# Patient Record
Sex: Female | Born: 1975 | Race: White | Hispanic: No | Marital: Married | Smoking: Former smoker
Health system: Southern US, Community
[De-identification: ages and names within clinical notes are randomized; demographics above are authoritative.]

## PROBLEM LIST (undated history)

## (undated) DIAGNOSIS — F4000 Agoraphobia, unspecified: Secondary | ICD-10-CM

## (undated) DIAGNOSIS — F32A Depression, unspecified: Secondary | ICD-10-CM

## (undated) DIAGNOSIS — T7840XA Allergy, unspecified, initial encounter: Secondary | ICD-10-CM

## (undated) HISTORY — DX: Depression, unspecified: F32.A

## (undated) HISTORY — PX: ABDOMINAL HYSTERECTOMY: SHX81

## (undated) HISTORY — DX: Allergy, unspecified, initial encounter: T78.40XA

## (undated) HISTORY — DX: Agoraphobia, unspecified: F40.00

---

## 2001-02-19 ENCOUNTER — Emergency Department (HOSPITAL_COMMUNITY): Admission: EM | Admit: 2001-02-19 | Discharge: 2001-02-19 | Payer: Self-pay | Admitting: *Deleted

## 2001-04-02 ENCOUNTER — Ambulatory Visit (HOSPITAL_COMMUNITY): Admission: RE | Admit: 2001-04-02 | Discharge: 2001-04-02 | Payer: Self-pay | Admitting: *Deleted

## 2001-04-02 ENCOUNTER — Encounter: Payer: Self-pay | Admitting: *Deleted

## 2001-05-12 ENCOUNTER — Ambulatory Visit (HOSPITAL_COMMUNITY): Admission: RE | Admit: 2001-05-12 | Discharge: 2001-05-12 | Payer: Self-pay | Admitting: *Deleted

## 2004-05-11 ENCOUNTER — Emergency Department (HOSPITAL_COMMUNITY): Admission: EM | Admit: 2004-05-11 | Discharge: 2004-05-11 | Payer: Self-pay

## 2005-06-22 ENCOUNTER — Emergency Department (HOSPITAL_COMMUNITY): Admission: EM | Admit: 2005-06-22 | Discharge: 2005-06-22 | Payer: Self-pay | Admitting: Emergency Medicine

## 2005-12-23 ENCOUNTER — Emergency Department (HOSPITAL_COMMUNITY): Admission: EM | Admit: 2005-12-23 | Discharge: 2005-12-23 | Payer: Self-pay | Admitting: Emergency Medicine

## 2006-12-11 IMAGING — CR DG CHEST 2V
2 series · 2 of 2 positions shown · non-contrast
Comparison: none

CLINICAL DATA: Cough and shortness of breath. 
 PA AND LATERAL CHEST - 2 VIEWS:

[view not recorded (1 of 2)]
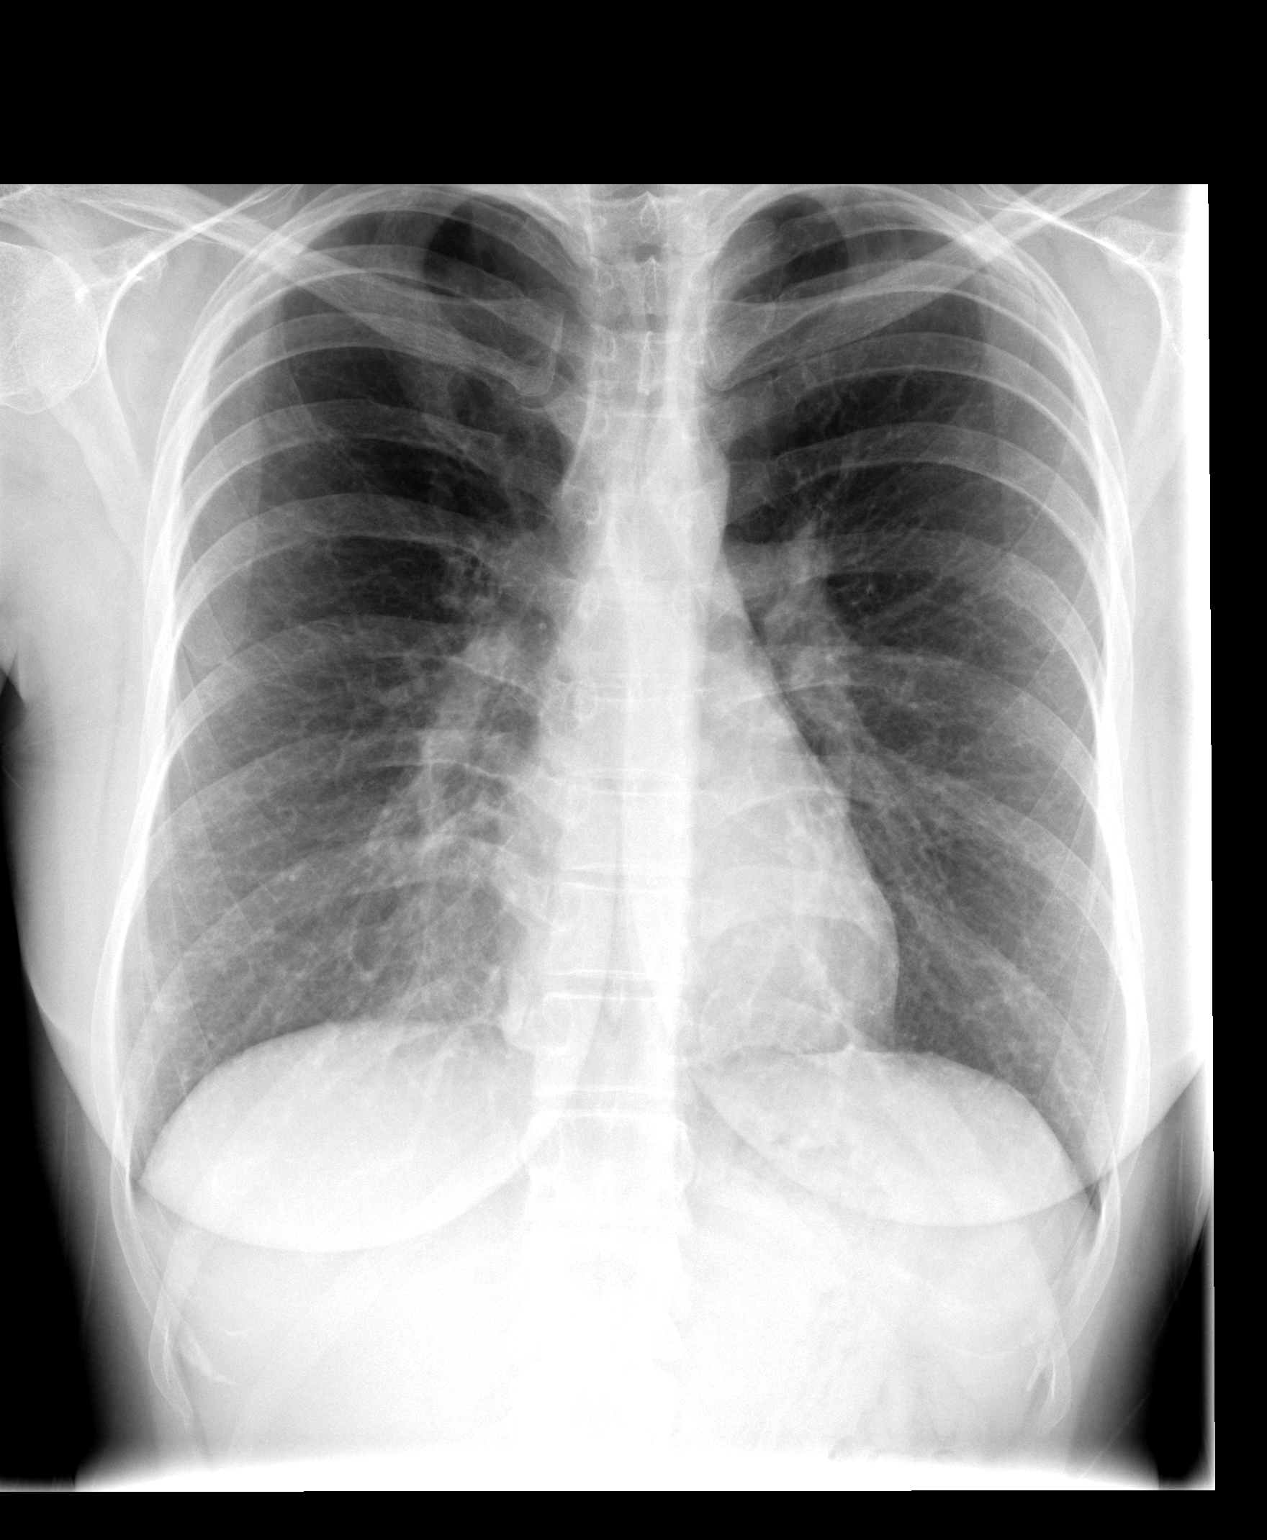

[view not recorded (2 of 2)]
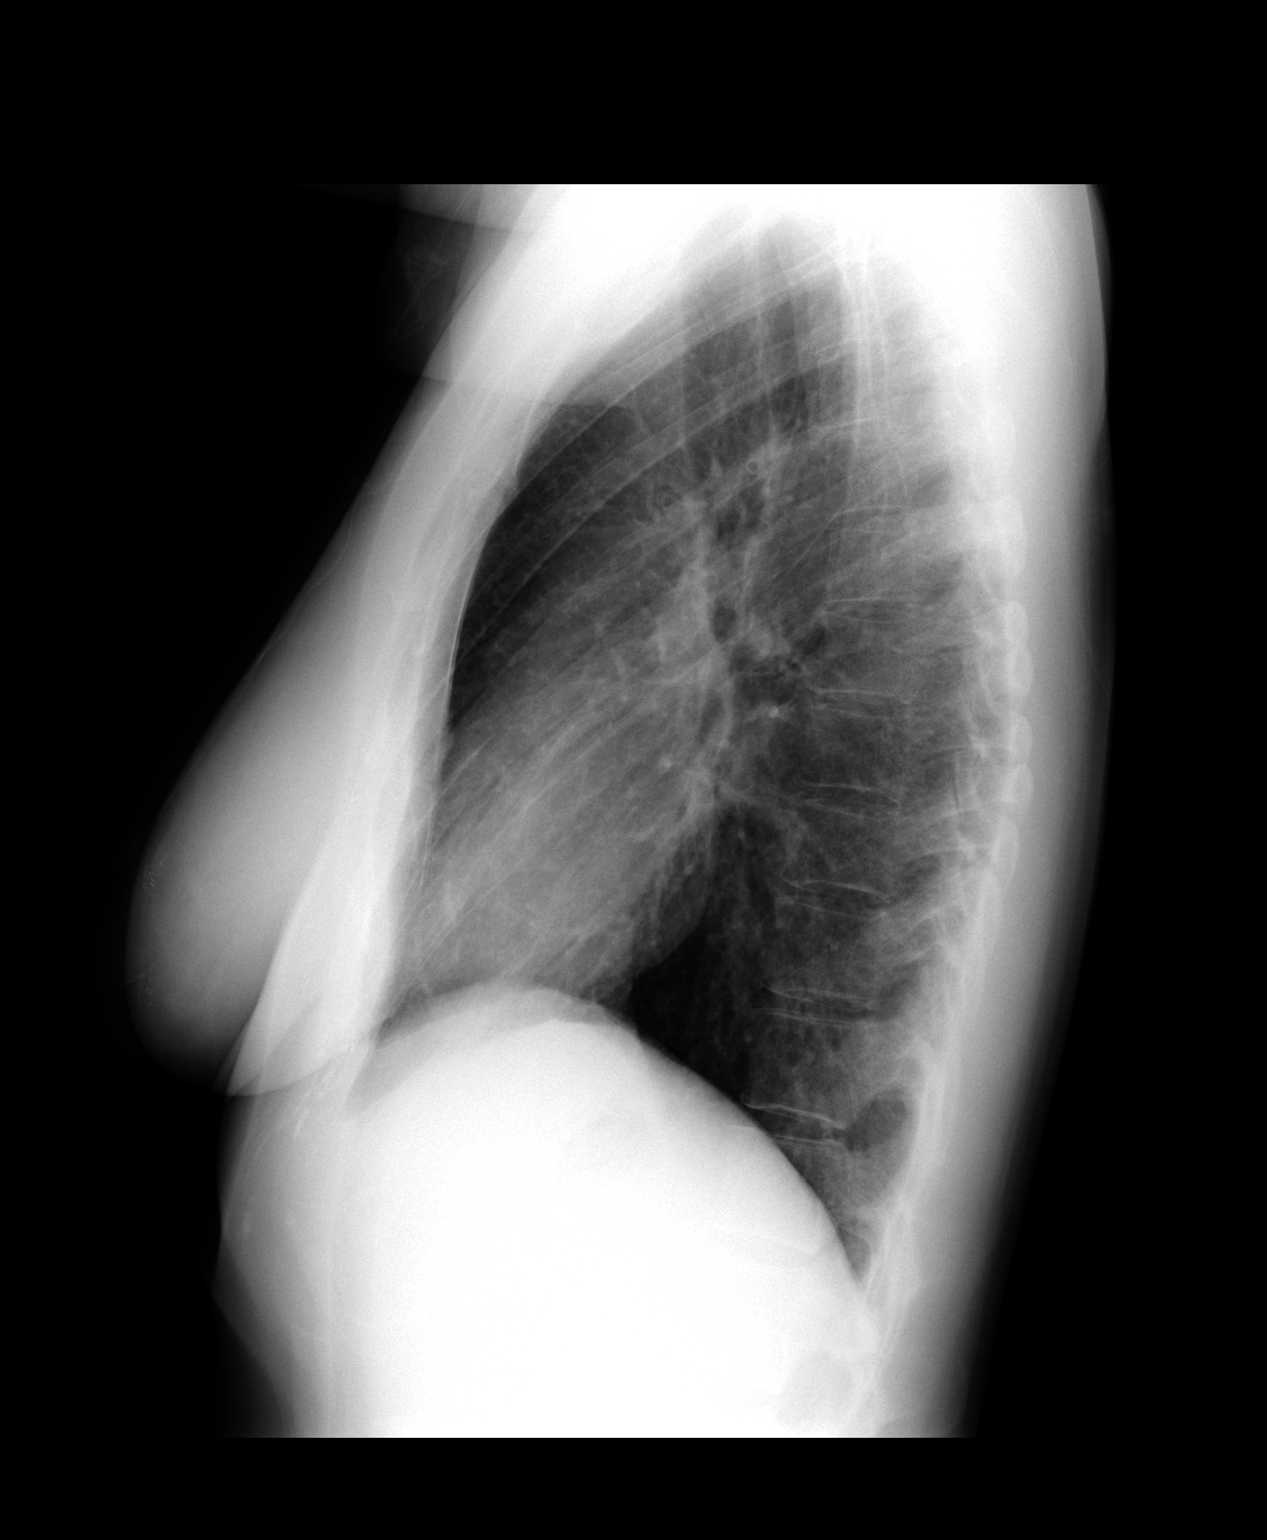

[2 of 2 positions shown; findings below may reference images not displayed]

FINDINGS: The chest appears hyperexpanded but clear.  No pleural effusion.  Heart size normal.  No focal bony abnormality.
IMPRESSION: Hyperexpansion of the lungs with no focal process.

## 2010-10-16 ENCOUNTER — Emergency Department: Payer: Self-pay | Admitting: Emergency Medicine

## 2015-05-11 ENCOUNTER — Other Ambulatory Visit: Payer: Self-pay | Admitting: Family Medicine

## 2015-05-11 DIAGNOSIS — R109 Unspecified abdominal pain: Secondary | ICD-10-CM

## 2018-06-06 ENCOUNTER — Encounter: Payer: Self-pay | Admitting: Emergency Medicine

## 2018-06-06 DIAGNOSIS — F4010 Social phobia, unspecified: Secondary | ICD-10-CM

## 2018-06-06 DIAGNOSIS — F4310 Post-traumatic stress disorder, unspecified: Secondary | ICD-10-CM

## 2018-06-06 DIAGNOSIS — F331 Major depressive disorder, recurrent, moderate: Secondary | ICD-10-CM | POA: Insufficient documentation

## 2018-06-17 ENCOUNTER — Ambulatory Visit: Payer: Self-pay | Admitting: Physician Assistant

## 2018-08-25 ENCOUNTER — Telehealth: Payer: Self-pay | Admitting: Physician Assistant

## 2018-08-25 NOTE — Telephone Encounter (Signed)
Pt. Called and said that the prestiq is giving her bad thoughts and she feels terrible. She wants to stop it and go back on prozac. Please give her a call back. [phone]

## 2018-08-25 NOTE — Telephone Encounter (Signed)
Please advise 

## 2018-08-25 NOTE — Telephone Encounter (Signed)
Can you get chart for me please

## 2018-08-25 NOTE — Telephone Encounter (Signed)
I haven't seen her in 5 months.  When did these sx start?  She needs to make appt for [place] to discuss.  If suicidal, go to [facility].

## 2018-08-25 NOTE — Telephone Encounter (Signed)
done

## 2018-08-26 NOTE — Telephone Encounter (Signed)
Left voicemail making sure she's coming to appt tomorrow with provider, instructed to call back if not able to make it.

## 2018-08-26 NOTE — Telephone Encounter (Signed)
Tried to reach pt but phone says unable to reach and call back later

## 2018-08-27 ENCOUNTER — Ambulatory Visit (INDEPENDENT_AMBULATORY_CARE_PROVIDER_SITE_OTHER): Payer: 59 | Admitting: Physician Assistant

## 2018-08-27 ENCOUNTER — Encounter: Payer: Self-pay | Admitting: Physician Assistant

## 2018-08-27 VITALS — BP 173/90 | HR 88

## 2018-08-27 DIAGNOSIS — F331 Major depressive disorder, recurrent, moderate: Secondary | ICD-10-CM | POA: Diagnosis not present

## 2018-08-27 DIAGNOSIS — R030 Elevated blood-pressure reading, without diagnosis of hypertension: Secondary | ICD-10-CM

## 2018-08-27 DIAGNOSIS — F411 Generalized anxiety disorder: Secondary | ICD-10-CM | POA: Diagnosis not present

## 2018-08-27 MED ORDER — LORAZEPAM 0.5 MG PO TABS: 0.5000 mg | ORAL_TABLET | Freq: Three times a day (TID) | ORAL | 1 refills | Status: DC | PRN

## 2018-08-27 MED ORDER — FLUOXETINE HCL 20 MG PO CAPS: 20.0000 mg | ORAL_CAPSULE | Freq: Every day | ORAL | 1 refills | Status: DC

## 2018-08-27 NOTE — Progress Notes (Signed)
[facility]  Patient ID: Victoria Mendez,  MRN: [id number]  PCP: Patient, No Pcp Per  Date of Evaluation: 08/27/2018 Time spent:25 minutes  Chief Complaint:  Chief Complaint    Medication Problem; Anxiety; Depression      HISTORY/CURRENT STATUS: HPI Not doing well.   She started having brain zaps in Dec.  If she took the Pristiq even a few hours late, she would have brain zaps.  States she was on 100 mg and on her own, went down to 50 mg approximately 1 month ago and stayed on that for 2 weeks and then stopped it.  States she has felt horrible.  She has had brain zaps and nausea.  She has had vomiting and lost 9 pounds in the past couple of weeks.  Has been able to keep broth down not eating anything else.  She has felt some dizziness.  She denies fever chills, flulike symptoms, or headache.  Denies muscle weakness, stiffness, rigidity, falling.  States she has looked online and has seen where many people lawsuits against the company that makes Pristiq because of these type things.  She does understand that she gone off of the medicine like this but did not want to be on anything that caused her to have brain zaps if she was an hour or so late in taking the pill.  Individual Medical History/ Review of Systems: Changes? :Yes See above  Past medications for mental health diagnoses include: Prozac  Allergies: Penicillins  Current Medications:  Current Outpatient Medications:  .  LORazepam (ATIVAN) 0.5 MG tablet, Take 1-2 tablets (0.5-1 mg total) by mouth every 8 (eight) hours as needed for anxiety., Disp: 90 tablet, Rfl: 1 .  FLUoxetine (PROZAC) 20 MG capsule, Take 1 capsule (20 mg total) by mouth daily., Disp: 30 capsule, Rfl: 1 Medication Side Effects: none  Family Medical/ Social History: Changes? No  MENTAL HEALTH EXAM:  Blood pressure (!) 173/90, pulse 88.There is no height or weight on file to calculate BMI.  Repeat BP was 164/82  General Appearance: Casual and  Guarded  Eye Contact:  Good  Speech:  Clear and Coherent  Volume:  Normal  Mood:  Anxious and Depressed  Affect:  Depressed, Tearful and Anxious  Thought Process:  Goal Directed  Orientation:  Full (Time, Place, and Person)  Thought Content: Logical   Suicidal Thoughts:  No  Homicidal Thoughts:  No  Memory:  WNL  Judgement:  Good  Insight:  Good  Psychomotor Activity:  Normal no tics or tremor, no gait abnormalities.  No rigidity.  Concentration:  Concentration: Good  Recall:  Good  Fund of Knowledge: Good  Language: Good  Assets:  Desire for Improvement  ADL's:  Intact  Cognition: WNL  Prognosis:  Good    DIAGNOSES:    ICD-10-CM   1. Major depressive disorder, recurrent episode, moderate (HCC) F33.1   2. Generalized anxiety disorder F41.1   3. Elevated blood pressure reading R03.0   Withdrawal of Pristiq  Receiving Psychotherapy: No    RECOMMENDATIONS: I spent 25 minutes with her and 50% of that time was spent in counseling concerning abrupt withdrawal of antidepressants.  Even though she did wean down on the Pristiq, it was still too fast.  I recommended that she never do that but if she has questions or problems, call and we can discuss the situation and wean off the medication more safely.  She verbalizes understanding. We will watch her blood pressure.  I do not  think it is I have extreme concern at this point.  She is never had elevated blood pressures in the past, she has been eating a lot of broth which is very high in sodium. She knows to call if she has increased temperature, muscle stiffness or rigidity, headache, worsening of vomiting to the point that she is unable to keep anything down, diarrhea, or any concerning symptoms. Start Prozac 20 mg today.  She has taken this in the past and tolerated it well.   Continue Ativan 0.5 mg 1-2 every 8 hours as needed anxiety. Out of work today and tomorrow and may return on 08/31/2018.  Note was given. Return in 1 week.  If  at any point she gets worse, call or go to the emergency room.   Victoria Overly, PA-C

## 2018-09-03 ENCOUNTER — Ambulatory Visit: Payer: 59 | Admitting: Physician Assistant

## 2018-09-18 ENCOUNTER — Other Ambulatory Visit: Payer: Self-pay | Admitting: Physician Assistant

## 2018-10-05 ENCOUNTER — Other Ambulatory Visit: Payer: Self-pay | Admitting: Physician Assistant

## 2018-10-06 ENCOUNTER — Ambulatory Visit: Payer: 59 | Admitting: Physician Assistant

## 2018-12-13 ENCOUNTER — Other Ambulatory Visit: Payer: Self-pay | Admitting: Physician Assistant

## 2018-12-14 NOTE — Telephone Encounter (Signed)
Please fill

## 2018-12-15 ENCOUNTER — Other Ambulatory Visit: Payer: Self-pay | Admitting: Physician Assistant

## 2018-12-15 NOTE — Telephone Encounter (Signed)
Patient must make and keep appt for more.

## 2018-12-15 NOTE — Telephone Encounter (Signed)
Last visit 08/2018, suppose to have followed up within next week?

## 2019-01-11 ENCOUNTER — Other Ambulatory Visit: Payer: Self-pay | Admitting: Physician Assistant

## 2019-01-12 NOTE — Telephone Encounter (Signed)
30 submitted with instructions she keep an appt. It's schedule 05/28 now they are asking for 90 day.

## 2019-01-15 ENCOUNTER — Ambulatory Visit (INDEPENDENT_AMBULATORY_CARE_PROVIDER_SITE_OTHER): Payer: 59 | Admitting: Physician Assistant

## 2019-01-15 ENCOUNTER — Encounter: Payer: Self-pay | Admitting: Physician Assistant

## 2019-01-15 ENCOUNTER — Other Ambulatory Visit: Payer: Self-pay

## 2019-01-15 DIAGNOSIS — F411 Generalized anxiety disorder: Secondary | ICD-10-CM

## 2019-01-15 DIAGNOSIS — F331 Major depressive disorder, recurrent, moderate: Secondary | ICD-10-CM | POA: Diagnosis not present

## 2019-01-15 MED ORDER — FLUOXETINE HCL 40 MG PO CAPS: 40.0000 mg | ORAL_CAPSULE | Freq: Every day | ORAL | 0 refills | Status: DC
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
## 2019-02-15 ENCOUNTER — Ambulatory Visit: Payer: 59 | Admitting: Physician Assistant

## 2019-03-17 ENCOUNTER — Ambulatory Visit: Payer: 59 | Admitting: Physician Assistant

## 2019-04-13 ENCOUNTER — Other Ambulatory Visit: Payer: Self-pay | Admitting: Physician Assistant

## 2019-04-22 ENCOUNTER — Ambulatory Visit: Payer: 59 | Admitting: Physician Assistant

## 2019-05-26 ENCOUNTER — Ambulatory Visit (INDEPENDENT_AMBULATORY_CARE_PROVIDER_SITE_OTHER): Payer: 59 | Admitting: Physician Assistant

## 2019-05-26 ENCOUNTER — Other Ambulatory Visit: Payer: Self-pay

## 2019-05-26 ENCOUNTER — Encounter: Payer: Self-pay | Admitting: Physician Assistant

## 2019-05-26 DIAGNOSIS — F411 Generalized anxiety disorder: Secondary | ICD-10-CM

## 2019-05-26 DIAGNOSIS — F331 Major depressive disorder, recurrent, moderate: Secondary | ICD-10-CM | POA: Diagnosis not present

## 2019-05-26 MED ORDER — FLUOXETINE HCL 60 MG PO TABS: 60.0000 mg | ORAL_TABLET | Freq: Every day | ORAL | 1 refills | Status: DC

## 2019-05-26 MED ORDER — LORAZEPAM 1 MG PO TABS: 1.0000 mg | ORAL_TABLET | Freq: Two times a day (BID) | ORAL | 1 refills | Status: DC | PRN

## 2019-05-26 NOTE — Progress Notes (Signed)
[facility]  Patient ID: Victoria Mendez,  MRN: [id number]  PCP: Patient, No Pcp Per  Date of Evaluation: 05/26/2019 Time spent:15 minutes  Chief Complaint:  Chief Complaint    Anxiety; Follow-up     Virtual Visit via Telephone Note  I connected with patient by a video enabled telemedicine application or telephone, with their informed consent, and verified patient privacy and that I am speaking with the correct person using two identifiers.  I am private, in my office and the patient is at work.  I discussed the limitations, risks, security and privacy concerns of performing an evaluation and management service by telephone and the availability of in person appointments. I also discussed with the patient that there may be a patient responsible charge related to this service. The patient expressed understanding and agreed to proceed.   I discussed the assessment and treatment plan with the patient. The patient was provided an opportunity to ask questions and all were answered. The patient agreed with the plan and demonstrated an understanding of the instructions.   The patient was advised to call back or seek an in-person evaluation if the symptoms worsen or if the condition fails to improve as anticipated.  I provided 15 minutes of non-face-to-face time during this encounter.  HISTORY/CURRENT STATUS: HPI For routine med check.  More anxious.  Might lose her job and then her insurance.  Afraid she or family members will get covid and die.  "I'm getting more panic attacks and the Ativan isn't quite strong enough.  It does help but I wish it would help more."  Also feels generally anxious too, b/c of these triggers. Also feels more sad, but is able to function.  She denies at all triggered by what is going on in the world right now.  Her energy and motivation are good.  She does not cry easily.  Things at home are going well.  She has a great relationship with her husband and  children and the rest of their family.  She has also been sad because they found her aunt dead last week, thinking it is COVID but not sure.  "So I have just had a lot on my shoulders lately."  Denies suicidal or homicidal thoughts.  Denies dizziness, syncope, seizures, numbness, tingling, tremor, tics, unsteady gait, slurred speech, confusion. Denies muscle or joint pain, stiffness, or dystonia.  Individual Medical History/ Review of Systems: Changes? :No    Past medications for mental health diagnoses include: Prozac, Pristiq  Allergies: Penicillins  Current Medications:  Current Outpatient Medications:  .  FLUoxetine HCl 60 MG TABS, Take 60 mg by mouth daily., Disp: 30 tablet, Rfl: 1 .  LORazepam (ATIVAN) 1 MG tablet, Take 1 tablet (1 mg total) by mouth 2 (two) times daily as needed for anxiety., Disp: 60 tablet, Rfl: 1 Medication Side Effects: none  Family Medical/ Social History: Changes? No  MENTAL HEALTH EXAM:  There were no vitals taken for this visit.There is no height or weight on file to calculate BMI.  General Appearance: Unable to assess  Eye Contact:  Unable to assess  Speech:  Clear and Coherent  Volume:  Normal  Mood:  Euthymic  Affect:  Unable to assess  Thought Process:  Goal Directed and Descriptions of Associations: Intact  Orientation:  Full (Time, Place, and Person)  Thought Content: Logical   Suicidal Thoughts:  No  Homicidal Thoughts:  No  Memory:  WNL  Judgement:  Good  Insight:  Good  Psychomotor Activity:  Unable to assess  Concentration:  Concentration: Good  Recall:  Good  Fund of Knowledge: Good  Language: Good  Assets:  Desire for Improvement  ADL's:  Intact  Cognition: WNL  Prognosis:  Good    DIAGNOSES:    ICD-10-CM   1. Major depressive disorder, recurrent episode, moderate (HCC)  F33.1   2. Generalized anxiety disorder  F41.1     Receiving Psychotherapy: No    RECOMMENDATIONS:  We discussed the different options.  PDMP was  reviewed and 90 Ativan has lasted 2-1/2 months.  I think it is a good idea to increase that dose as well as the dose of Prozac.  Benefits, risks, side effects were discussed and she accepts. Increase Prozac to 60 mg p.o. daily. Increase Ativan to 1 mg, twice daily as needed. Consider counseling.   Return in 4 to 6 weeks.   Donnal Moat, PA-C

## 2019-05-27 ENCOUNTER — Other Ambulatory Visit: Payer: Self-pay | Admitting: Physician Assistant

## 2019-05-27 MED ORDER — FLUOXETINE HCL 20 MG PO CAPS: 60.0000 mg | ORAL_CAPSULE | Freq: Every day | ORAL | 1 refills | Status: DC

## 2019-06-20 ENCOUNTER — Other Ambulatory Visit: Payer: Self-pay | Admitting: Physician Assistant

## 2019-06-23 ENCOUNTER — Ambulatory Visit (INDEPENDENT_AMBULATORY_CARE_PROVIDER_SITE_OTHER): Payer: 59 | Admitting: Physician Assistant

## 2019-06-23 ENCOUNTER — Other Ambulatory Visit: Payer: Self-pay

## 2019-06-23 ENCOUNTER — Encounter: Payer: Self-pay | Admitting: Physician Assistant

## 2019-06-23 DIAGNOSIS — F331 Major depressive disorder, recurrent, moderate: Secondary | ICD-10-CM

## 2019-06-23 DIAGNOSIS — F411 Generalized anxiety disorder: Secondary | ICD-10-CM

## 2019-06-23 NOTE — Progress Notes (Signed)
[facility]  Patient ID: Victoria Mendez,  MRN: [id number]  PCP: Patient, No Pcp Per  Date of Evaluation: 06/23/2019 Time spent:15 minutes  Chief Complaint:  Chief Complaint    Anxiety; Depression; Follow-up     Virtual Visit via Telephone Note  I connected with patient by a video enabled telemedicine application or telephone, with their informed consent, and verified patient privacy and that I am speaking with the correct person using two identifiers.  I am private, in my office and the patient is home.  I discussed the limitations, risks, security and privacy concerns of performing an evaluation and management service by telephone and the availability of in person appointments. I also discussed with the patient that there may be a patient responsible charge related to this service. The patient expressed understanding and agreed to proceed.   I discussed the assessment and treatment plan with the patient. The patient was provided an opportunity to ask questions and all were answered. The patient agreed with the plan and demonstrated an understanding of the instructions.   The patient was advised to call back or seek an in-person evaluation if the symptoms worsen or if the condition fails to improve as anticipated.  I provided 15 minutes of non-face-to-face time during this encounter.  HISTORY/CURRENT STATUS: HPI For routine med check.  States she's about 50% better since we increased Prozac and Ativan. Under a lot of stress b/c job and was crying b/c of work but is now a lot better.  Her family has noticed too.  She is a whole lot more able to handle the stressors.  She has been looking for a new job though and has found something where she can work remote with a company in [place].  She is anxious about changing but knows she needs to do it "for my own mental health."  Patient denies loss of interest in usual activities and is able to enjoy things.  Denies decreased  energy or motivation.  Appetite has not changed.  No extreme sadness, tearfulness, or feelings of hopelessness.  Denies any changes in concentration, making decisions or remembering things.  Denies suicidal or homicidal thoughts.  She sleeps well most of the time.  She is usually only taking 1 Ativan a day, in the evening and that helps her relax, not have racing thoughts, and then she can go on to sleep.  Denies dizziness, syncope, seizures, numbness, tingling, tremor, tics, unsteady gait, slurred speech, confusion. Denies muscle or joint pain, stiffness, or dystonia.  Individual Medical History/ Review of Systems: Changes? :No    Past medications for mental health diagnoses include: Prozac, Pristiq  Allergies: Penicillins  Current Medications:  Current Outpatient Medications:  .  FLUoxetine (PROZAC) 20 MG capsule, TAKE 3 CAPSULES BY MOUTH EVERY DAY, Disp: 270 capsule, Rfl: 0 .  LORazepam (ATIVAN) 1 MG tablet, Take 1 tablet (1 mg total) by mouth 2 (two) times daily as needed for anxiety., Disp: 60 tablet, Rfl: 1 Medication Side Effects: none  Family Medical/ Social History: Changes? Yes doesn't like her job and will be changing soon.  MENTAL HEALTH EXAM:  There were no vitals taken for this visit.There is no height or weight on file to calculate BMI.  General Appearance: Unable to assess  Eye Contact:  Unable to assess  Speech:  Clear and Coherent  Volume:  Normal  Mood:  Euthymic  Affect:  Unable to assess  Thought Process:  Goal Directed  Orientation:  Full (Time, Place, and  Person)  Thought Content: Logical   Suicidal Thoughts:  No  Homicidal Thoughts:  No  Memory:  WNL  Judgement:  Good  Insight:  Good  Psychomotor Activity:  Unable to assess  Concentration:  Concentration: Good  Recall:  Good  Fund of Knowledge: Good  Language: Good  Assets:  Desire for Improvement  ADL's:  Intact  Cognition: WNL  Prognosis:  Good    DIAGNOSES:    ICD-10-CM   1. Generalized  anxiety disorder  F41.1   2. Major depressive disorder, recurrent episode, moderate ([place])  F33.1      Receiving Psychotherapy: No    RECOMMENDATIONS:  PDMP was reviewed. I am glad to see her doing so well! Continue Prozac 20 mg, 3 p.o. daily. Continue Ativan 1 mg, 1 p.o. twice daily as needed. Return in 2 to 3 months.  Donnal Moat, PA-C

## 2019-08-05 ENCOUNTER — Other Ambulatory Visit: Payer: Self-pay | Admitting: Physician Assistant

## 2019-08-23 ENCOUNTER — Ambulatory Visit (INDEPENDENT_AMBULATORY_CARE_PROVIDER_SITE_OTHER): Payer: 59 | Admitting: Physician Assistant

## 2019-08-23 ENCOUNTER — Encounter: Payer: Self-pay | Admitting: Physician Assistant

## 2019-08-23 DIAGNOSIS — F411 Generalized anxiety disorder: Secondary | ICD-10-CM

## 2019-08-23 DIAGNOSIS — F17200 Nicotine dependence, unspecified, uncomplicated: Secondary | ICD-10-CM

## 2019-08-23 DIAGNOSIS — F3341 Major depressive disorder, recurrent, in partial remission: Secondary | ICD-10-CM

## 2019-08-23 MED ORDER — LORAZEPAM 1 MG PO TABS: 1.0000 mg | ORAL_TABLET | Freq: Two times a day (BID) | ORAL | 5 refills | Status: DC | PRN

## 2019-08-23 NOTE — Progress Notes (Signed)
[facility]  Patient ID: Victoria Mendez,  MRN: [id number]  PCP: Patient, No Pcp Per  Date of Evaluation: 08/23/2019 Time spent:15 minutes  Chief Complaint:  Chief Complaint    Anxiety; Depression; Follow-up     Virtual Visit via Telephone Note  I connected with patient by a video enabled telemedicine application or telephone, with their informed consent, and verified patient privacy and that I am speaking with the correct person using two identifiers.  I am private, in my office and the patient is at work.   I discussed the limitations, risks, security and privacy concerns of performing an evaluation and management service by telephone and the availability of in person appointments. I also discussed with the patient that there may be a patient responsible charge related to this service. The patient expressed understanding and agreed to proceed.   I discussed the assessment and treatment plan with the patient. The patient was provided an opportunity to ask questions and all were answered. The patient agreed with the plan and demonstrated an understanding of the instructions.   The patient was advised to call back or seek an in-person evaluation if the symptoms worsen or if the condition fails to improve as anticipated.  I provided 15 minutes of non-face-to-face time during this encounter.  HISTORY/CURRENT STATUS: HPI for routine med check.  States she is doing well.  She had considered asking about Chantix.  She really wants to quit smoking.  However, she just started a second job today and prefers to hold off on trying to quit smoking since this will be a bit stressful.  She also does not want to try a new medication, which might make her sick while she is being trained on his new job.  Feels that her medicines are working well.  She is able to enjoy things.  Energy and motivation are good.  She sleeps well.  Does not cry easily.  No suicidal or homicidal  thoughts.  Anxiety is well controlled.  She does need the Ativan usually twice a day but it is still effective.  She is having more generalized anxiety than panic attacks.  And it is completely situational.  Denies dizziness, syncope, seizures, numbness, tingling, tremor, tics, unsteady gait, slurred speech, confusion. Denies muscle or joint pain, stiffness, or dystonia.  Individual Medical History/ Review of Systems: Changes? :No    Past medications for mental health diagnoses include: Prozac, Pristiq  Allergies: Penicillins  Current Medications:  Current Outpatient Medications:  .  FLUoxetine (PROZAC) 20 MG capsule, TAKE 3 CAPSULES BY MOUTH EVERY DAY, Disp: 270 capsule, Rfl: 0 .  LORazepam (ATIVAN) 1 MG tablet, Take 1 tablet (1 mg total) by mouth 2 (two) times daily as needed for anxiety., Disp: 60 tablet, Rfl: 5 Medication Side Effects: none  Family Medical/ Social History: Changes? 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   MENTAL HEALTH EXAM:  There were no vitals taken for this visit.There is no height or weight on file to calculate BMI.  General Appearance: Unable to assess  Eye Contact:  Unable to assess  Speech:  Clear and Coherent  Volume:  Normal  Mood:  Euthymic  Affect:  Unable to assess  Thought Process:  Goal Directed and Descriptions of Associations: Intact  Orientation:  Full (Time, Place, and Person)  Thought Content: Logical   Suicidal Thoughts:  No  Homicidal Thoughts:  No  Memory:  WNL  Judgement:  Good  Insight:  Good  Psychomotor Activity:  Unable to assess  Concentration:  Concentration: Good  Recall:  Good  Fund of Knowledge: Good  Language: Good  Assets:  Desire for Improvement  ADL's:  Intact  Cognition: WNL  Prognosis:  Good    DIAGNOSES:    ICD-10-CM   1. 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 Smoker  F17.200   3. Generalized anxiety disorder  F41.1     Receiving  Psychotherapy: No    RECOMMENDATIONS:  We discussed the smoking cessation.  We both want her to quit, but I agree it is probably not the best time right now.  I am afraid we will be setting her up for failure.  At the next visit, we can discuss more thoroughly then. Continue Prozac 20 mg, 3 p.o. daily. Continue Ativan, 1 p.o. twice daily as needed. Return in 2 months and we will discuss smoking cessation.   Donnal Moat, PA-C

## 2019-10-21 ENCOUNTER — Other Ambulatory Visit: Payer: Self-pay

## 2019-10-21 MED ORDER — FLUOXETINE HCL 20 MG PO CAPS: 60.0000 mg | ORAL_CAPSULE | Freq: Every day | ORAL | 0 refills | Status: DC

## 2019-10-28 ENCOUNTER — Encounter: Payer: Self-pay | Admitting: Physician Assistant

## 2019-10-28 ENCOUNTER — Ambulatory Visit (INDEPENDENT_AMBULATORY_CARE_PROVIDER_SITE_OTHER): Payer: No Typology Code available for payment source | Admitting: Physician Assistant

## 2019-10-28 DIAGNOSIS — F17200 Nicotine dependence, unspecified, uncomplicated: Secondary | ICD-10-CM

## 2019-10-28 DIAGNOSIS — Z634 Disappearance and death of family member: Secondary | ICD-10-CM

## 2019-10-28 DIAGNOSIS — F411 Generalized anxiety disorder: Secondary | ICD-10-CM

## 2019-10-28 DIAGNOSIS — F330 Major depressive disorder, recurrent, mild: Secondary | ICD-10-CM

## 2019-10-28 DIAGNOSIS — F4320 Adjustment disorder, unspecified: Secondary | ICD-10-CM

## 2019-10-28 MED ORDER — BUSPIRONE HCL 15 MG PO TABS: ORAL_TABLET | ORAL | 1 refills | Status: DC

## 2019-10-28 MED ORDER — HYDROXYZINE HCL 10 MG PO TABS: 10.0000 mg | ORAL_TABLET | Freq: Three times a day (TID) | ORAL | 1 refills | Status: DC | PRN

## 2019-10-28 NOTE — Progress Notes (Signed)
[facility]  Patient ID: Victoria Mendez,  MRN: [id number]  PCP: Patient, No Pcp Per  Date of Evaluation: 10/28/2019 Time spent:20 minutes  Chief Complaint:  Chief Complaint    Anxiety; Depression; Follow-up     Virtual Visit via Telephone Note  I connected with patient by a video enabled telemedicine application or telephone, with their informed consent, and verified patient privacy and that I am speaking with the correct person using two identifiers.  I am private, in my office and the patient is home.   I discussed the limitations, risks, security and privacy concerns of performing an evaluation and management service by telephone and the availability of in person appointments. I also discussed with the patient that there may be a patient responsible charge related to this service. The patient expressed understanding and agreed to proceed.   I discussed the assessment and treatment plan with the patient. The patient was provided an opportunity to ask questions and all were answered. The patient agreed with the plan and demonstrated an understanding of the instructions.   The patient was advised to call back or seek an in-person evaluation if the symptoms worsen or if the condition fails to improve as anticipated.  I provided 20 minutes of non-face-to-face time during this encounter.  HISTORY/CURRENT STATUS: HPI For routine med check.  Her Dad died 10/22/19.  It was sudden but she's had some signs that everything is ok. She cries some but feels like what she is going through is normal grief.    Wants to wean off the Ativan. Doesn't want to be addicted to it.  Knows that if she continues to take it, she will eventually need a higher dose in order for it to be as effective as it is now.  She has already started to wean off of it.  She is not having any withdrawals.  Thinks her mood is good under the circumstances.  She is able to enjoy things.  Energy and motivation are  good.  Appetite is good.  Weight is stable.  She sleeps pretty good.  Denies suicidal or homicidal thoughts.  She is trying to quit smoking.  She has weaned herself down to about 9 cigarettes a day from 20-30 a few months ago.  Denies dizziness, syncope, seizures, numbness, tingling, tremor, tics, unsteady gait, slurred speech, confusion. Denies muscle or joint pain, stiffness, or dystonia.  Individual Medical History/ Review of Systems: Changes? :Yes  she and all her family had covid on 09/20/2019.   Past medications for mental health diagnoses include: Prozac, Pristiq  Allergies: Penicillins  Current Medications:  Current Outpatient Medications:  .  Ascorbic Acid (VITAMIN C) 100 MG tablet, Take 100 mg by mouth daily., Disp: , Rfl:  .  cholecalciferol (VITAMIN D3) 25 MCG (1000 UNIT) tablet, Take 1,000 Units by mouth daily., Disp: , Rfl:  .  FLUoxetine (PROZAC) 20 MG capsule, Take 3 capsules (60 mg total) by mouth daily., Disp: 270 capsule, Rfl: 0 .  zinc gluconate 50 MG tablet, Take 50 mg by mouth daily., Disp: , Rfl:  .  busPIRone (BUSPAR) 15 MG tablet, 1/3 po bid for 1 week, then 2/3 po bid for 1 week, then 1 po bid., Disp: 60 tablet, Rfl: 1 .  hydrOXYzine (ATARAX/VISTARIL) 10 MG tablet, Take 1-3 tablets (10-30 mg total) by mouth every 8 (eight) hours as needed., Disp: 90 tablet, Rfl: 1 Medication Side Effects: none  Family Medical/ Social History: Changes? Yes Her Dad died suddenly on  09/24/2019  MENTAL HEALTH EXAM:  There were no vitals taken for this visit.There is no height or weight on file to calculate BMI.  General Appearance: unable to assess  Eye Contact:  unable to assess  Speech:  Clear and Coherent and Normal Rate  Volume:  Normal  Mood:  Euthymic  Affect:  unable to assess  Thought Process:  Goal Directed and Descriptions of Associations: Intact  Orientation:  Full (Time, Place, and Person)  Thought Content: Logical   Suicidal Thoughts:  No  Homicidal Thoughts:  No   Memory:  WNL  Judgement:  Good  Insight:  Good  Psychomotor Activity:  unable to assess  Concentration:  Concentration: Good  Recall:  Good  Fund of Knowledge: Good  Language: Good  Assets:  Desire for Improvement  ADL's:  Intact  Cognition: WNL  Prognosis:  Good    DIAGNOSES:    ICD-10-CM   1. Mild episode of recurrent major depressive disorder (HCC)  F33.0   2. Generalized anxiety disorder  F41.1   3. Bereavement reaction  F43.20    Z63.4   4. Smoker  F17.200     Receiving Psychotherapy: No    RECOMMENDATIONS:  PDMP reviewed. I spent 20 mins w/ her.  We discussed weaning off the Ativan.  She's already decreased to 1.5 mg qhs for a week.  Will decrease to 1mg  qhs for a wk, then 0.5 mg qhs for a week, then stop. Since she is still having some anxiety I recommend adding BuSpar.  We discussed the benefits, risks, side effects and she would like to try it.  I also recommend adding hydroxyzine for the anxiety and sleep.  She has mostly been using the Ativan for sleep anyway so hopefully this will work just as well. Start BuSpar 15 mg, one third p.o. twice daily for 1 week, then two thirds p.o. twice daily for 1 week, then 1 p.o. twice daily. Start hydroxyzine 10 mg, 1-3 p.o. every 8 hours as needed anxiety or sleep. Continue Prozac 20 mg, 3 p.o. daily. As far as the smoking cessation goes, I applauded her for trying, but I would like for her to do 1 thing at a time.  If she can stay at 9 cigarettes a day that is okay for right now.  In a few months, then go down to 8 cigarettes a day for a few weeks and then 7 cigarettes a day for a few weeks, and on and on.  I am afraid she is setting herself up for failure trying to do too many things at once.  She verbalizes understanding. Return in 4 weeks.  , PA-C

## 2019-11-20 ENCOUNTER — Other Ambulatory Visit: Payer: Self-pay | Admitting: Physician Assistant

## 2019-12-02 ENCOUNTER — Encounter: Payer: Self-pay | Admitting: Physician Assistant

## 2019-12-02 ENCOUNTER — Ambulatory Visit (INDEPENDENT_AMBULATORY_CARE_PROVIDER_SITE_OTHER): Payer: Self-pay | Admitting: Physician Assistant

## 2019-12-02 DIAGNOSIS — F330 Major depressive disorder, recurrent, mild: Secondary | ICD-10-CM

## 2019-12-02 DIAGNOSIS — F411 Generalized anxiety disorder: Secondary | ICD-10-CM

## 2019-12-02 DIAGNOSIS — F17200 Nicotine dependence, unspecified, uncomplicated: Secondary | ICD-10-CM

## 2019-12-02 NOTE — Progress Notes (Signed)
[facility]  Patient ID: Victoria Mendez,  MRN: [id number]  PCP: Patient, No Pcp Per  Date of Evaluation: 12/02/2019 Time spent:20 minutes  Chief Complaint:  Chief Complaint    Depression; Anxiety     Virtual Visit via Telephone Note  I connected with patient by a video enabled telemedicine application or telephone, with their informed consent, and verified patient privacy and that I am speaking with the correct person using two identifiers.  I am private, in my office and the patient is home.   I discussed the limitations, risks, security and privacy concerns of performing an evaluation and management service by telephone and the availability of in person appointments. I also discussed with the patient that there may be a patient responsible charge related to this service. The patient expressed understanding and agreed to proceed.   I discussed the assessment and treatment plan with the patient. The patient was provided an opportunity to ask questions and all were answered. The patient agreed with the plan and demonstrated an understanding of the instructions.   The patient was advised to call back or seek an in-person evaluation if the symptoms worsen or if the condition fails to improve as anticipated.  I provided 20 minutes of non-face-to-face time during this encounter.  HISTORY/CURRENT STATUS: HPI For routine med check.  Has been more anxious lately and had to go back on the Ativan. "I tried to get off of it but I started having more PA than I was so I had to do it.  The hydroxyzine wasn't helping." She had the covid shot last week, the [place] one that in the past few days has been stopped d/t SE.  That worried her to death for a few days, partly b/c her dad a day after he had his covid shot (not [place] and [place]) but it still scared her.  They are not certain what was the cause of death for her dad.  Patient states she is usually taking only one Ativan  0.5 mg/day but sometimes she has to take 2 of those pills.  She is able to enjoy things.  Energy and motivation are good.  Appetite is good.  Weight is stable.  She sleeps pretty good.  Denies suicidal or homicidal thoughts.  She is trying to quit smoking.  She has weaned herself down to about 5 cigarettes a day from 20-30 a few months ago.  Denies dizziness, syncope, seizures, numbness, tingling, tremor, tics, unsteady gait, slurred speech, confusion. Denies muscle or joint pain, stiffness, or dystonia.  Individual Medical History/ Review of Systems: Changes? :No     Past medications for mental health diagnoses include: Prozac, Pristiq  Allergies: Penicillins  Current Medications:  Current Outpatient Medications:  .  Ascorbic Acid (VITAMIN C) 100 MG tablet, Take 100 mg by mouth daily., Disp: , Rfl:  .  busPIRone (BUSPAR) 15 MG tablet, TAKE 1 TABLET TWICE DALY, Disp: 180 tablet, Rfl: 0 .  cholecalciferol (VITAMIN D3) 25 MCG (1000 UNIT) tablet, Take 1,000 Units by mouth daily., Disp: , Rfl:  .  FLUoxetine (PROZAC) 20 MG capsule, Take 3 capsules (60 mg total) by mouth daily., Disp: 270 capsule, Rfl: 0 .  hydrOXYzine (ATARAX/VISTARIL) 10 MG tablet, Take 1-3 tablets (10-30 mg total) by mouth every 8 (eight) hours as needed., Disp: 90 tablet, Rfl: 1 .  LORazepam (ATIVAN) 0.5 MG tablet, Take 0.5-1 mg by mouth daily., Disp: , Rfl:  .  zinc gluconate 50 MG tablet, Take  50 mg by mouth daily., Disp: , Rfl:  Medication Side Effects: none  Family Medical/ Social History: Changes? No  MENTAL HEALTH EXAM:  There were no vitals taken for this visit.There is no height or weight on file to calculate BMI.  General Appearance: unable to assess  Eye Contact:  unable to assess  Speech:  Clear and Coherent and Normal Rate  Volume:  Normal  Mood:  Euthymic  Affect:  unable to assess  Thought Process:  Goal Directed and Descriptions of Associations: Intact  Orientation:  Full (Time, Place, and Person)   Thought Content: Logical   Suicidal Thoughts:  No  Homicidal Thoughts:  No  Memory:  WNL  Judgement:  Good  Insight:  Good  Psychomotor Activity:  unable to assess  Concentration:  Concentration: Good  Recall:  Good  Fund of Knowledge: Good  Language: Good  Assets:  Desire for Improvement  ADL's:  Intact  Cognition: WNL  Prognosis:  Good    DIAGNOSES:    ICD-10-CM   1. Generalized anxiety disorder  F41.1   2. Mild episode of recurrent major depressive disorder (HCC)  F33.0   3. Smoker  F17.200     Receiving Psychotherapy: No    RECOMMENDATIONS:  PDMP reviewed. I spent 20 mins w/ her.  It is completely fine for her to take Ativan.  I understand her desire to be off of a controlled substance but I have no reason to believe she is getting addicted.  I reminded her that this is a very low dose and she needs to continue to take it as long as it is working.  Of course do not take it if not needed.  If she is having more mild anxiety, use the hydroxyzine. Continue Ativan 0.5 mg, 1 p.o. twice daily as needed. Continue BuSpar 15 mg, 1 p.o. twice daily.  If she is needing the Ativan more than twice a day, I recommend that she call and we will increase this.   Continue hydroxyzine 10 mg, 1-3 p.o. every 8 hours as needed anxiety or sleep. Continue Prozac 20 mg, 3 p.o. daily. Good job on decreasing the number of cigarettes she is smoking every day!  Keep up the good work. Return in 3 months.  Donnal Moat, PA-C

## 2020-02-03 ENCOUNTER — Telehealth: Payer: Self-pay | Admitting: Physician Assistant

## 2020-02-03 NOTE — Telephone Encounter (Signed)
LM to call back about a few questions before sending in Rx.

## 2020-02-03 NOTE — Telephone Encounter (Signed)
Please check w/ her and see if she's ever had a seizure or eating d/o. And how the anxiety is. I rec starting Wellbutrin if these things are neg.

## 2020-02-03 NOTE — Telephone Encounter (Signed)
Pt called reporting she is still having severe depression. Apt 7/16. Would like to try Wellbutrin or Zoloft. Currently taking Prozac 60 mg.  Advise [phone]   [facility]

## 2020-02-04 ENCOUNTER — Other Ambulatory Visit: Payer: Self-pay | Admitting: Physician Assistant

## 2020-02-04 MED ORDER — BUPROPION HCL ER (XL) 150 MG PO TB24: 150.0000 mg | ORAL_TABLET | Freq: Every day | ORAL | 1 refills | Status: DC

## 2020-02-04 NOTE — Telephone Encounter (Signed)
LM to call back to answer questions.

## 2020-02-04 NOTE — Telephone Encounter (Signed)
Prescription was sent

## 2020-02-04 NOTE — Telephone Encounter (Signed)
Pt returned call. She has never had seizure or eating d/o. Anxiety is fine. Pharmacy is [facility]. Advise pt if you send Rx to pharmacy [phone]

## 2020-02-12 ENCOUNTER — Other Ambulatory Visit: Payer: Self-pay | Admitting: Physician Assistant

## 2020-02-27 ENCOUNTER — Other Ambulatory Visit: Payer: Self-pay | Admitting: Physician Assistant

## 2020-02-29 ENCOUNTER — Other Ambulatory Visit: Payer: Self-pay | Admitting: Physician Assistant

## 2020-03-01 NOTE — Telephone Encounter (Signed)
Hold till apt on 07/16 to discuss dose. New start in June

## 2020-03-02 ENCOUNTER — Other Ambulatory Visit: Payer: Self-pay | Admitting: Physician Assistant

## 2020-03-03 ENCOUNTER — Encounter: Payer: Self-pay | Admitting: Physician Assistant

## 2020-03-03 ENCOUNTER — Telehealth (INDEPENDENT_AMBULATORY_CARE_PROVIDER_SITE_OTHER): Payer: Self-pay | Admitting: Physician Assistant

## 2020-03-03 DIAGNOSIS — F17200 Nicotine dependence, unspecified, uncomplicated: Secondary | ICD-10-CM

## 2020-03-03 DIAGNOSIS — R4189 Other symptoms and signs involving cognitive functions and awareness: Secondary | ICD-10-CM

## 2020-03-03 DIAGNOSIS — F330 Major depressive disorder, recurrent, mild: Secondary | ICD-10-CM

## 2020-03-03 DIAGNOSIS — F411 Generalized anxiety disorder: Secondary | ICD-10-CM

## 2020-03-03 DIAGNOSIS — Z634 Disappearance and death of family member: Secondary | ICD-10-CM

## 2020-03-03 DIAGNOSIS — F4320 Adjustment disorder, unspecified: Secondary | ICD-10-CM

## 2020-03-03 MED ORDER — LORAZEPAM 1 MG PO TABS: 1.0000 mg | ORAL_TABLET | Freq: Two times a day (BID) | ORAL | 5 refills | Status: DC | PRN

## 2020-03-03 NOTE — Telephone Encounter (Signed)
Is patient suppose to be taking?

## 2020-03-03 NOTE — Progress Notes (Signed)
[facility]  Patient ID: Victoria Mendez,  MRN: [id number]  PCP: Patient, No Pcp Per  Date of Evaluation: 03/03/2020 Time spent:30 minutes  Chief Complaint:  Chief Complaint    Anxiety; Depression      Virtual Visit via Video Note  I connected with patient by a video enabled telemedicine application with their informed consent, and verified patient privacy and that I am speaking with the correct person using two identifiers.  I am private, in my office and the patient is at home.  I discussed the limitations, risks, security and privacy concerns of performing an evaluation and management service by video and the availability of in person appointments. I also discussed with the patient that there may be a patient responsible charge related to this service. The patient expressed understanding and agreed to proceed.   I discussed the assessment and treatment plan with the patient. The patient was provided an opportunity to ask questions and all were answered. The patient agreed with the plan and demonstrated an understanding of the instructions.   The patient was advised to call back or seek an in-person evaluation if the symptoms worsen or if the condition fails to improve as anticipated.  I provided 30 minutes of non-face-to-face time during this encounter.   HISTORY/CURRENT STATUS: For routine med check.  A month ago, pt called stating she was having severe depression. We added wellbutrin which has helped a lot.  Is more able to enjoy things and isn't sad all the time. States she still has moments of course when she misses her dad a lot, and it's really hitting her that he's not coming back. But energy is better, as is motivation. Not crying as easily. Continues to isolate some but mostly d/t covid.  No SI/HI.   Biggest problems is she is having nightmares qhs involving her dad. He died Oct 09, 2019 and in the dreams, they usually involve him needing help in some way and she  can't help him. Wonders if this is nl.  Doesn't necessarily want tx but wants to understand what's going on.  In counseling wkly.   She stopped Prozac after we started Wellbutrin.  Didn't feel like it was helping. Not any more anxious since stopping it and going on the Wellbutrin and the Ativan continues to work.   Hasn't been able to quit smoking.  If anything, has increased her daily amount.   Denies dizziness, syncope, seizures, numbness, tingling, tremor, tics, unsteady gait, slurred speech, confusion. Denies muscle or joint pain, stiffness, or dystonia.  Individual Medical History/ Review of Systems: Changes? :No     Past medications for mental health diagnoses include: Prozac, Pristiq, Ativan, Buspar, Wellbutrin, Hydroxyzine  Allergies: Penicillins  Current Medications:  Current Outpatient Medications:  .  buPROPion (WELLBUTRIN XL) 150 MG 24 hr tablet, TAKE 1 TABLET BY MOUTH EVERY DAY, Disp: 30 tablet, Rfl: 0 .  busPIRone (BUSPAR) 15 MG tablet, TAKE 1 TABLET TWICE DALY, Disp: 180 tablet, Rfl: 0 .  cholecalciferol (VITAMIN D3) 25 MCG (1000 UNIT) tablet, Take 1,000 Units by mouth daily., Disp: , Rfl:  .  zinc gluconate 50 MG tablet, Take 50 mg by mouth daily., Disp: , Rfl:  .  Ascorbic Acid (VITAMIN C) 100 MG tablet, Take 100 mg by mouth daily. (Patient not taking: Reported on 03/03/2020), Disp: , Rfl:  .  LORazepam (ATIVAN) 1 MG tablet, Take 1 tablet (1 mg total) by mouth 2 (two) times daily as needed for anxiety., Disp: 60 tablet, Rfl:  5 Medication Side Effects: none  Family Medical/ Social History: Changes? No  MENTAL HEALTH EXAM:  There were no vitals taken for this visit.There is no height or weight on file to calculate BMI.  General Appearance: Casual, Neat and Well Groomed  Eye Contact:  Good  Speech:  Clear and Coherent and Normal Rate  Volume:  Normal  Mood:  Euthymic  Affect:  Appropriate  Thought Process:  Goal Directed and Descriptions of Associations: Intact   Orientation:  Full (Time, Place, and Person)  Thought Content: Logical   Suicidal Thoughts:  No  Homicidal Thoughts:  No  Memory:  WNL  Judgement:  Good  Insight:  Good  Psychomotor Activity:  appears nl from what I can see on video  Concentration:  Concentration: Good and Attention Span: Good  Recall:  Good  Fund of Knowledge: Good  Language: Good  Assets:  Desire for Improvement  ADL's:  Intact  Cognition: WNL  Prognosis:  Good    DIAGNOSES:    ICD-10-CM   1. Mild episode of recurrent major depressive disorder (HCC)  F33.0   2. Generalized anxiety disorder  F41.1   3. Bereavement reaction  F43.20    Z63.4   4. Smoker  F17.200   5. Complaint related to dreams  R41.89     Receiving Psychotherapy: Yes    RECOMMENDATIONS:  PDMP reviewed. I provided 30 minutes of non face to face time during this encounter. We discussed the dreams. Several treatments help w/ night terrors or disturbing dreams.  Doxazosin, Prazosin, or Seroquel. Benefits, risks, SE were discussed.  She prefers not to use meds at this time, but wants to make sure this can be normal under these circumstances. Assured her this can be a normal reaction to grief.  She will continue to discuss w/ her counselor, but will let me know if the dreams become more disturbing to the point she's not resting well. If we start one of the alpha blockers, I will need to see her in-office so we can check her BP. Continue Wellbutrin XL 150 mg, 1 qd. Continue Buspar 15 mg, 1 po bid. Continue Ativan 1 mg, 1 po bid prn.  Smoking cessation. Continue counseling.  Return in 4 to 6 weeks  Melony Overly, PA-C

## 2020-03-05 ENCOUNTER — Telehealth: Payer: Self-pay | Admitting: Physician Assistant

## 2020-03-05 NOTE — Telephone Encounter (Signed)
Victoria Mendez, Victoria Mendez are scheduled for a virtual visit with your provider today.    Just as we do with appointments in the office, we must obtain your consent to participate.  Your consent will be active for this visit and any virtual visit you may have with one of our providers in the next 365 days.    If you have a MyChart account, I can also send a copy of this consent to you electronically.  All virtual visits are billed to your insurance company just like a traditional visit in the office.  As this is a virtual visit, video technology does not allow for your provider to perform a traditional examination.  This may limit your provider's ability to fully assess your condition.  If your provider identifies any concerns that need to be evaluated in person or the need to arrange testing such as labs, EKG, etc, we will make arrangements to do so.    Although advances in technology are sophisticated, we cannot ensure that it will always work on either your end or our end.  If the connection with a video visit is poor, we may have to switch to a telephone visit.  With either a video or telephone visit, we are not always able to ensure that we have a secure connection.   I need to obtain your verbal consent now.   Are you willing to proceed with your visit today?   Victoria Mendez has provided verbal consent on 03/05/2020 for a virtual visit (video or telephone).   Melony Overly, PA-C 03/05/2020  11:33 AM

## 2020-03-25 ENCOUNTER — Other Ambulatory Visit: Payer: Self-pay | Admitting: Physician Assistant

## 2020-04-07 ENCOUNTER — Telehealth (INDEPENDENT_AMBULATORY_CARE_PROVIDER_SITE_OTHER): Payer: Self-pay | Admitting: Physician Assistant

## 2020-04-07 ENCOUNTER — Encounter: Payer: Self-pay | Admitting: Physician Assistant

## 2020-04-07 DIAGNOSIS — F411 Generalized anxiety disorder: Secondary | ICD-10-CM

## 2020-04-07 DIAGNOSIS — Z634 Disappearance and death of family member: Secondary | ICD-10-CM

## 2020-04-07 DIAGNOSIS — F3341 Major depressive disorder, recurrent, in partial remission: Secondary | ICD-10-CM

## 2020-04-07 DIAGNOSIS — F4320 Adjustment disorder, unspecified: Secondary | ICD-10-CM

## 2020-04-07 MED ORDER — BUPROPION HCL ER (XL) 150 MG PO TB24: 150.0000 mg | ORAL_TABLET | Freq: Every day | ORAL | 0 refills | Status: DC

## 2020-04-07 MED ORDER — LORAZEPAM 1 MG PO TABS: 1.0000 mg | ORAL_TABLET | Freq: Two times a day (BID) | ORAL | 2 refills | Status: DC | PRN

## 2020-04-07 NOTE — Progress Notes (Addendum)
[facility]  Patient ID: Victoria Mendez,  MRN: [id number]  PCP: Patient, No Pcp Per  Date of Evaluation: 04/07/2020 Time spent:20 minutes  Chief Complaint:  Chief Complaint    Follow-up     Virtual Visit via Telehealth  I connected with patient by a telephone, with their informed consent, and verified patient privacy and that I am speaking with the correct person using two identifiers.  I am private, in my office and the patient is at home.  I discussed the limitations, risks, security and privacy concerns of performing an evaluation and management service by telephone and the availability of in person appointments. I also discussed with the patient that there may be a patient responsible charge related to this service. The patient expressed understanding and agreed to proceed.   I discussed the assessment and treatment plan with the patient. The patient was provided an opportunity to ask questions and all were answered. The patient agreed with the plan and demonstrated an understanding of the instructions.   The patient was advised to call back or seek an in-person evaluation if the symptoms worsen or if the condition fails to improve as anticipated.  I provided 20 minutes of non-face-to-face time during this encounter.  HISTORY/CURRENT STATUS: For routine med check.  Doing a lot better, since starting the Wellbutrin! 'I can feel it. I cry some still, when I think about my dad's death, but overall I am so much better with the depression.  I have a relationship with my stepmom now and my sister.  We talk almost daily.  Energy and motivation are good.  States her focus and concentration is better than it has been.  No suicidal or homicidal thoughts.  She still gets anxious at times.  Ativan helps a lot.  She is still having nightmares but they are not quite as bad.  They are more tolerable.  She has no trouble falling asleep.  She does feel rested when she gets up the next  day.  Patient denies increased energy with decreased need for sleep, no increased talkativeness, no racing thoughts, no impulsivity or risky behaviors, no increased spending, no increased libido, no grandiosity, no increased irritability or anger, and no hallucinations.  Denies dizziness, syncope, seizures, numbness, tingling, tremor, tics, unsteady gait, slurred speech, confusion. Denies muscle or joint pain, stiffness, or dystonia.  Individual Medical History/ Review of Systems: Changes? :No     Past medications for mental health diagnoses include: Prozac, Pristiq, Ativan, Buspar, Wellbutrin, Hydroxyzine  Allergies: Penicillins  Current Medications:  Current Outpatient Medications:  .  buPROPion (WELLBUTRIN XL) 150 MG 24 hr tablet, Take 1 tablet (150 mg total) by mouth daily., Disp: 90 tablet, Rfl: 0 .  cholecalciferol (VITAMIN D3) 25 MCG (1000 UNIT) tablet, Take 1,000 Units by mouth daily., Disp: , Rfl:  .  LORazepam (ATIVAN) 1 MG tablet, Take 1 tablet (1 mg total) by mouth 2 (two) times daily as needed for anxiety., Disp: 60 tablet, Rfl: 2 .  zinc gluconate 50 MG tablet, Take 50 mg by mouth daily., Disp: , Rfl:  .  Ascorbic Acid (VITAMIN C) 100 MG tablet, Take 100 mg by mouth daily. (Patient not taking: Reported on 03/03/2020), Disp: , Rfl:  Medication Side Effects: none  Family Medical/ Social History: Changes? No  MENTAL HEALTH EXAM:  There were no vitals taken for this visit.There is no height or weight on file to calculate BMI.  General Appearance: Unable to assess  Eye Contact:  Good  Speech:  Clear and Coherent and Normal Rate  Volume:  Normal  Mood:  Euthymic  Affect:  Unable to assess  Thought Process:  Goal Directed and Descriptions of Associations: Intact  Orientation:  Full (Time, Place, and Person)  Thought Content: Logical   Suicidal Thoughts:  No  Homicidal Thoughts:  No  Memory:  WNL  Judgement:  Good  Insight:  Good  Psychomotor Activity:  Unable to assess   Concentration:  Concentration: Good and Attention Span: Good  Recall:  Good  Fund of Knowledge: Good  Language: Good  Assets:  Desire for Improvement  ADL's:  Intact  Cognition: WNL  Prognosis:  Good    DIAGNOSES:    ICD-10-CM   1. Recurrent major depressive disorder, in partial remission (HCC)  F33.41   2. Generalized anxiety disorder  F41.1   3. Bereavement reaction  F43.20    Z63.4     Receiving Psychotherapy: Yes  through [facility]   RECOMMENDATIONS:  PDMP reviewed. I provided 20 minutes of non face to face time during this encounter.   I am glad to see her doing so well! [facility] [place] so [facility]. Continue Wellbutrin XL 150 mg, 1 qd. Continue Ativan 1 mg, 1 po bid prn.  Continue counseling.  Return in 3 months  Melony Overly, [place]

## 2020-05-09 ENCOUNTER — Encounter: Payer: Self-pay | Admitting: Internal Medicine

## 2020-06-20 ENCOUNTER — Ambulatory Visit: Payer: Self-pay | Admitting: Gastroenterology

## 2020-06-24 ENCOUNTER — Other Ambulatory Visit: Payer: Self-pay | Admitting: Physician Assistant

## 2020-06-26 ENCOUNTER — Encounter: Payer: Self-pay | Admitting: Physician Assistant

## 2020-06-26 ENCOUNTER — Telehealth (INDEPENDENT_AMBULATORY_CARE_PROVIDER_SITE_OTHER): Payer: Self-pay | Admitting: Physician Assistant

## 2020-06-26 DIAGNOSIS — F17200 Nicotine dependence, unspecified, uncomplicated: Secondary | ICD-10-CM

## 2020-06-26 DIAGNOSIS — F4321 Adjustment disorder with depressed mood: Secondary | ICD-10-CM

## 2020-06-26 DIAGNOSIS — F331 Major depressive disorder, recurrent, moderate: Secondary | ICD-10-CM

## 2020-06-26 DIAGNOSIS — F411 Generalized anxiety disorder: Secondary | ICD-10-CM

## 2020-06-26 MED ORDER — LORAZEPAM 1 MG PO TABS: 1.0000 mg | ORAL_TABLET | Freq: Two times a day (BID) | ORAL | 2 refills | Status: DC | PRN

## 2020-06-26 MED ORDER — BUPROPION HCL ER (XL) 150 MG PO TB24: 150.0000 mg | ORAL_TABLET | Freq: Every day | ORAL | 1 refills | Status: DC

## 2020-06-26 MED ORDER — SERTRALINE HCL 100 MG PO TABS
ORAL_TABLET | ORAL | 1 refills | Status: DC
Start: 2020-06-26 — End: 2020-08-07

## 2020-06-26 NOTE — Progress Notes (Signed)
[facility]  Patient ID: Victoria Mendez,  MRN: [id number]  PCP: Patient, No Pcp Per  Date of Evaluation: 11/8//2021 Time spent:20 minutes  Chief Complaint:  Chief Complaint    Follow-up     Virtual Visit via Telehealth  I connected with patient by a telephone, with their informed consent, and verified patient privacy and that I am speaking with the correct person using two identifiers.  I am private, in my office and the patient is at home.  I discussed the limitations, risks, security and privacy concerns of performing an evaluation and management service by telephone and the availability of in person appointments. I also discussed with the patient that there may be a patient responsible charge related to this service. The patient expressed understanding and agreed to proceed.   I discussed the assessment and treatment plan with the patient. The patient was provided an opportunity to ask questions and all were answered. The patient agreed with the plan and demonstrated an understanding of the instructions.   The patient was advised to call back or seek an in-person evaluation if the symptoms worsen or if the condition fails to improve as anticipated.  I provided 20 minutes of non-face-to-face time during this encounter.  HISTORY/CURRENT STATUS: For routine med check.  Has been more depressed. Still grieving for her Dad. Cries easily, doesn't want to do anything, energy and motivation are fair to good, feels like the Wellbutrin has helped, especially with that and focus. But if she drinks 2 cups, she can get panicky. Her sister is on Zoloft and asks if that would help her? No suicidal or homicidal thoughts.  She still gets anxious at times.  Ativan helps a lot.  She is still having nightmares occasionally but not as bad as they were. She has no trouble falling asleep.  She does feel rested when she gets up the next day.  Patient denies increased energy with decreased  need for sleep, no increased talkativeness, no racing thoughts, no impulsivity or risky behaviors, no increased spending, no increased libido, no grandiosity, no increased irritability or anger, and no hallucinations.  Denies dizziness, syncope, seizures, numbness, tingling, tremor, tics, unsteady gait, slurred speech, confusion. Denies muscle or joint pain, stiffness, or dystonia.  Individual Medical History/ Review of Systems: Changes? :No     Past medications for mental health diagnoses include: Prozac, Pristiq, Ativan, Buspar, Wellbutrin, Hydroxyzine  Allergies: Penicillins  Current Medications:  Current Outpatient Medications:  .  buPROPion (WELLBUTRIN XL) 150 MG 24 hr tablet, Take 1 tablet (150 mg total) by mouth daily., Disp: 90 tablet, Rfl: 1 .  cholecalciferol (VITAMIN D3) 25 MCG (1000 UNIT) tablet, Take 1,000 Units by mouth daily., Disp: , Rfl:  .  LORazepam (ATIVAN) 1 MG tablet, Take 1 tablet (1 mg total) by mouth 2 (two) times daily as needed for anxiety., Disp: 60 tablet, Rfl: 2 .  zinc gluconate 50 MG tablet, Take 50 mg by mouth daily., Disp: , Rfl:  .  Ascorbic Acid (VITAMIN C) 100 MG tablet, Take 100 mg by mouth daily. (Patient not taking: Reported on 03/03/2020), Disp: , Rfl:  .  sertraline (ZOLOFT) 100 MG tablet, 1/2 po qd for 2 weeks, then increase to 1 po qd., Disp: 30 tablet, Rfl: 1 Medication Side Effects: none  Family Medical/ Social History: Changes? No  MENTAL HEALTH EXAM:  There were no vitals taken for this visit.There is no height or weight on file to calculate BMI.  General Appearance: Unable to  assess  Eye Contact:  unable to assess  Speech:  Clear and Coherent and Normal Rate  Volume:  Normal  Mood:  Euthymic  Affect:  Unable to assess  Thought Process:  Goal Directed and Descriptions of Associations: Intact  Orientation:  Full (Time, Place, and Person)  Thought Content: Logical   Suicidal Thoughts:  No  Homicidal Thoughts:  No  Memory:  WNL   Judgement:  Good  Insight:  Good  Psychomotor Activity:  Unable to assess  Concentration:  Concentration: Good and Attention Span: Good  Recall:  Good  Fund of Knowledge: Good  Language: Good  Assets:  Desire for Improvement  ADL's:  Intact  Cognition: WNL  Prognosis:  Good    DIAGNOSES:    ICD-10-CM   1. Major depressive disorder, recurrent episode, moderate (HCC)  F33.1   2. Grieving  F43.21   3. Generalized anxiety disorder  F41.1   4. Smoker  F17.200     Receiving Psychotherapy: Yes  through [facility]   RECOMMENDATIONS:  PDMP reviewed. I provided 20 minutes of non face to face time during this encounter.   We discussed the depression and I think it would be a good idea if we restart an SSRI.  She is only been on Prozac in that class.  Zoloft is a great drug and is a good combination with the Wellbutrin.  Increasing the Wellbutrin is not really an option because she gets more anxious if she drinks caffeine so do not want to increase the anxiety. We discussed the benefits, risks, side effects of Zoloft and she accepts. Start Zoloft 100 mg, 1/2 po Continue Wellbutrin XL 150 mg, 1 qd. Continue Ativan 1 mg, 1 po bid prn.  Continue counseling.  Return in 4 to 6 weeks.  Melony Overly, PA-C

## 2020-06-26 NOTE — Telephone Encounter (Signed)
Has apt this morning 11/08

## 2020-08-07 ENCOUNTER — Encounter: Payer: Self-pay | Admitting: Physician Assistant

## 2020-08-07 ENCOUNTER — Telehealth (INDEPENDENT_AMBULATORY_CARE_PROVIDER_SITE_OTHER): Payer: Self-pay | Admitting: Physician Assistant

## 2020-08-07 DIAGNOSIS — F17200 Nicotine dependence, unspecified, uncomplicated: Secondary | ICD-10-CM

## 2020-08-07 DIAGNOSIS — F3341 Major depressive disorder, recurrent, in partial remission: Secondary | ICD-10-CM

## 2020-08-07 DIAGNOSIS — F4321 Adjustment disorder with depressed mood: Secondary | ICD-10-CM

## 2020-08-07 DIAGNOSIS — F411 Generalized anxiety disorder: Secondary | ICD-10-CM

## 2020-08-07 MED ORDER — SERTRALINE HCL 100 MG PO TABS: 150.0000 mg | ORAL_TABLET | Freq: Every day | ORAL | 0 refills | Status: DC

## 2020-08-07 NOTE — Progress Notes (Signed)
[facility]  Patient ID: Victoria Mendez,  MRN: [id number]  PCP: Patient, No Pcp Per  Date of Evaluation: 08/07/2020 Time spent:20 minutes  Chief Complaint:  Chief Complaint    Anxiety; Depression     Virtual Visit via Telehealth  I connected with patient by telephone, with their informed consent, and verified patient privacy and that I am speaking with the correct person using two identifiers.  I am private, in my office and the patient is at home.  I discussed the limitations, risks, security and privacy concerns of performing an evaluation and management service by telephone and the availability of in person appointments. I also discussed with the patient that there may be a patient responsible charge related to this service. The patient expressed understanding and agreed to proceed.   I discussed the assessment and treatment plan with the patient. The patient was provided an opportunity to ask questions and all were answered. The patient agreed with the plan and demonstrated an understanding of the instructions.   The patient was advised to call back or seek an in-person evaluation if the symptoms worsen or if the condition fails to improve as anticipated.  I provided 20 minutes of non-face-to-face time during this encounter.  HISTORY/CURRENT STATUS: HPI For routine med check.   We started Zoloft at the last visit. Doing a lot better. About 50%! Not crying like she was.  She still has trouble enjoying things that she used to like to do.  Energy and motivation are still low.  She does go to work but that is about all that she has the energy to do.  Appetite and weight are stable.  He is not isolating.  Sleeps okay most of the time.  No suicidal or homicidal thoughts.  Anxiety is more generalized and is still present but the lorazepam is helpful when needed.  No panic attacks reported.  Patient denies increased energy with decreased need for sleep, no increased  talkativeness, no racing thoughts, no impulsivity or risky behaviors, no increased spending, no increased libido, no grandiosity, no increased irritability or anger, and no hallucinations.  Denies dizziness, syncope, seizures, numbness, tingling, tremor, tics, unsteady gait, slurred speech, confusion. Denies muscle or joint pain, stiffness, or dystonia.  Individual Medical History/ Review of Systems: Changes? :No    Past medications for mental health diagnoses include: Prozac, Pristiq, Ativan, Buspar, Wellbutrin, Hydroxyzine  Allergies: Penicillins  Current Medications:  Current Outpatient Medications:  .  buPROPion (WELLBUTRIN XL) 150 MG 24 hr tablet, Take 1 tablet (150 mg total) by mouth daily., Disp: 90 tablet, Rfl: 1 .  cholecalciferol (VITAMIN D3) 25 MCG (1000 UNIT) tablet, Take 1,000 Units by mouth daily., Disp: , Rfl:  .  LORazepam (ATIVAN) 1 MG tablet, Take 1 tablet (1 mg total) by mouth 2 (two) times daily as needed for anxiety., Disp: 60 tablet, Rfl: 2 .  zinc gluconate 50 MG tablet, Take 50 mg by mouth daily., Disp: , Rfl:  .  Ascorbic Acid (VITAMIN C) 100 MG tablet, Take 100 mg by mouth daily. (Patient not taking: No sig reported), Disp: , Rfl:  .  sertraline (ZOLOFT) 100 MG tablet, Take 1.5 tablets (150 mg total) by mouth daily., Disp: 135 tablet, Rfl: 0 Medication Side Effects: none  Family Medical/ Social History: Changes? No  MENTAL HEALTH EXAM:  There were no vitals taken for this visit.There is no height or weight on file to calculate BMI.  General Appearance: unable to assess  Eye Contact:  unable to assess  Speech:  Clear and Coherent and Normal Rate  Volume:  Normal  Mood:  Euthymic  Affect:  unable to assess  Thought Process:  Goal Directed and Descriptions of Associations: Intact  Orientation:  Full (Time, Place, and Person)  Thought Content: Logical   Suicidal Thoughts:  No  Homicidal Thoughts:  No  Memory:  WNL  Judgement:  Good  Insight:  Good   Psychomotor Activity:  unable to assess  Concentration:  Concentration: Good  Recall:  Good  Fund of Knowledge: Good  Language: Good  Assets:  Desire for Improvement  ADL's:  Intact  Cognition: WNL  Prognosis:  Good    DIAGNOSES:    ICD-10-CM   1. Recurrent major depressive disorder, in partial remission (HCC)  F33.41   2. Generalized anxiety disorder  F41.1   3. Grieving  F43.21   4. Smoker  F17.200     Receiving Psychotherapy: Yes    RECOMMENDATIONS:  PDMP was reviewed. I provided 25 minutes of nonface-to-face time during this encounter, discussing options for treatment.  I am glad that she is doing better but feel that we could get even more improvement with an increase of Zoloft.  This will help with anhedonia, decreased energy and motivation as well as help prevent the winter blues which she sometimes has.  She would like to try it. Increase Zoloft 100 mg to 1.5 pills daily. Continue Wellbutrin XL 150 mg, 1 p.o. every morning. Continue Ativan 1 mg, 1 p.o. twice daily as needed. Continue therapy. Return in 6 weeks.  Melony Overly, PA-C

## 2020-10-23 ENCOUNTER — Other Ambulatory Visit: Payer: Self-pay | Admitting: Physician Assistant

## 2020-11-12 ENCOUNTER — Other Ambulatory Visit: Payer: Self-pay | Admitting: Physician Assistant

## 2020-11-27 ENCOUNTER — Other Ambulatory Visit: Payer: Self-pay | Admitting: Physician Assistant

## 2020-11-27 NOTE — Telephone Encounter (Signed)
Next apt 5/31

## 2020-12-04 ENCOUNTER — Encounter: Payer: Self-pay | Admitting: Internal Medicine

## 2021-01-16 ENCOUNTER — Encounter: Payer: Self-pay | Admitting: Physician Assistant

## 2021-01-16 ENCOUNTER — Telehealth (INDEPENDENT_AMBULATORY_CARE_PROVIDER_SITE_OTHER): Payer: No Typology Code available for payment source | Admitting: Physician Assistant

## 2021-01-16 DIAGNOSIS — F3342 Major depressive disorder, recurrent, in full remission: Secondary | ICD-10-CM | POA: Diagnosis not present

## 2021-01-16 DIAGNOSIS — F4010 Social phobia, unspecified: Secondary | ICD-10-CM | POA: Diagnosis not present

## 2021-01-16 DIAGNOSIS — F411 Generalized anxiety disorder: Secondary | ICD-10-CM

## 2021-01-16 DIAGNOSIS — F17200 Nicotine dependence, unspecified, uncomplicated: Secondary | ICD-10-CM

## 2021-01-16 DIAGNOSIS — G4700 Insomnia, unspecified: Secondary | ICD-10-CM

## 2021-01-16 MED ORDER — BUPROPION HCL ER (XL) 150 MG PO TB24: 150.0000 mg | ORAL_TABLET | Freq: Every day | ORAL | 0 refills | Status: DC

## 2021-01-16 MED ORDER — LORAZEPAM 1 MG PO TABS: 1.0000 mg | ORAL_TABLET | Freq: Three times a day (TID) | ORAL | 1 refills | Status: DC | PRN

## 2021-01-16 MED ORDER — SERTRALINE HCL 100 MG PO TABS: 200.0000 mg | ORAL_TABLET | Freq: Every day | ORAL | 0 refills | Status: DC

## 2021-01-16 NOTE — Progress Notes (Signed)
[facility]  Patient ID: Victoria Mendez,  MRN: [id number]  PCP: Toma Deiters, MD  Date of Evaluation: 01/16/2021 Time spent:25 minutes  Chief Complaint:  Chief Complaint    Anxiety; Depression; Follow-up     Virtual Visit via Telehealth  I connected with patient by telephone, with their informed consent, and verified patient privacy and that I am speaking with the correct person using two identifiers.  I am private, in my office and the patient is at home.  I discussed the limitations, risks, security and privacy concerns of performing an evaluation and management service by telephone and the availability of in person appointments. I also discussed with the patient that there may be a patient responsible charge related to this service. The patient expressed understanding and agreed to proceed.   I discussed the assessment and treatment plan with the patient. The patient was provided an opportunity to ask questions and all were answered. The patient agreed with the plan and demonstrated an understanding of the instructions.   The patient was advised to call back or seek an in-person evaluation if the symptoms worsen or if the condition fails to improve as anticipated.  I provided 25  minutes of non-face-to-face time during this encounter.  HISTORY/CURRENT STATUS: HPI overdue for routine med check.  Has been agoraphobic, noticed in the past few months, since things are opening back up after covid. Doesn't matter how big or small a crowd is, or family or close friends or what.  She has not driven in 5 years so that is not even a factor. Had a home sleep test since LOV, but hasn't gotten results yet because her doctor will not give them to her over the phone.  She has been so nervous to get out of the house to do anything.  She knows she will have to though to discuss the sleep test.  She is unable to sleep unless she takes Ativan 2 mg.  She does not take Ativan during the  day.  Also reports not being able to focus well.  Gets distracted easily.  It does affect her work, (works from home) because she cannot concentrate very well.  Patient denies loss of interest in usual activities and is able to enjoy things.  Denies decreased energy or motivation.  Appetite has not changed.  No extreme sadness, tearfulness, or feelings of hopelessness. Denies suicidal or homicidal thoughts.  Patient denies increased energy with decreased need for sleep, no increased talkativeness, no racing thoughts, no impulsivity or risky behaviors, no increased spending, no increased libido, no grandiosity, no increased irritability or anger, and no hallucinations.  Denies dizziness, syncope, seizures, numbness, tingling, tremor, tics, unsteady gait, slurred speech, confusion. Denies muscle or joint pain, stiffness, or dystonia.  Individual Medical History/ Review of Systems: Changes? :No   Past medications for mental health diagnoses include: Prozac, Pristiq, Ativan, Buspar, Wellbutrin, Hydroxyzine  Allergies: Penicillins  Current Medications:  Current Outpatient Medications:  .  cholecalciferol (VITAMIN D3) 25 MCG (1000 UNIT) tablet, Take 1,000 Units by mouth daily., Disp: , Rfl:  .  esomeprazole (NEXIUM) 40 MG capsule, Take 40 mg by mouth daily at 12 noon., Disp: , Rfl:  .  nicotine polacrilex (NICORETTE) 4 MG gum, Take 4 mg by mouth as needed for smoking cessation., Disp: , Rfl:  .  zinc gluconate 50 MG tablet, Take 50 mg by mouth daily., Disp: , Rfl:  .  Ascorbic Acid (VITAMIN C) 100 MG tablet, Take 100 mg  by mouth daily. (Patient not taking: No sig reported), Disp: , Rfl:  .  buPROPion (WELLBUTRIN XL) 150 MG 24 hr tablet, Take 1 tablet (150 mg total) by mouth daily., Disp: 90 tablet, Rfl: 0 .  LORazepam (ATIVAN) 1 MG tablet, Take 1 tablet (1 mg total) by mouth every 8 (eight) hours as needed for anxiety., Disp: 75 tablet, Rfl: 1 .  sertraline (ZOLOFT) 100 MG tablet, Take 2 tablets  (200 mg total) by mouth daily., Disp: 180 tablet, Rfl: 0 Medication Side Effects: none  Family Medical/ Social History: Changes? Yes she is doing well with smoking cessation.  MENTAL HEALTH EXAM:  There were no vitals taken for this visit.There is no height or weight on file to calculate BMI.  General Appearance: Unable to assess  Eye Contact:  Unable to assess  Speech:  Clear and Coherent and Normal Rate  Volume:  Normal  Mood:  Anxious  Affect:  Unable to assess  Thought Process:  Goal Directed and Descriptions of Associations: Circumstantial  Orientation:  Full (Time, Place, and Person)  Thought Content: Obsessions and Rumination   Suicidal Thoughts:  No  Homicidal Thoughts:  No  Memory:  WNL  Judgement:  Good  Insight:  Good  Psychomotor Activity:  Unable to assess  Concentration:  Concentration: Fair and Attention Span: Fair  Recall:  Good  Fund of Knowledge: Good  Language: Good  Assets:  Desire for Improvement  ADL's:  Intact  Cognition: WNL  Prognosis:  Good    DIAGNOSES:    ICD-10-CM   1. Social anxiety disorder  F40.10   2. Generalized anxiety disorder  F41.1   3. Major depression, recurrent, full remission (HCC)  F33.42   4. Insomnia, unspecified type  G47.00   5. Smoker  F17.200     Receiving Psychotherapy: No    RECOMMENDATIONS:  PDMP was reviewed.  I provided 25 minutes of non-face-to-face time during this encounter, including time spent before and after the visit in records review, medical decision making, and charting.  Discussed agoraphobia, will add another dose of Ativan for as needed during the day.  Do not drive until she knows how this is making her feel.  She verbalizes understanding. As far as decreased concentration goes, I feel that anxiety is making it worse.  Recommend increasing the Zoloft once again.  Increasing the Wellbutrin may be more effective for the easy distractibility, but that comes with the cost of increasing the anxiety. I am  glad to see that she is weaning off tobacco!  Keep up the good work and continue Nicorette gum really as long if she needs it. Continue Wellbutrin XL 150 mg, 1 p.o. every morning. Continue Ativan 1 mg, 1/2-1 p.o. 3 times daily as needed anxiety.  I have increased the quantity from 60 pills to 75 pills.  She understands to take half to 1 in the morning or afternoon when she knows she may become anxious, and hopefully that will help prevent it. Increase Zoloft 100 mg to 2 pills daily. Recommend counseling. Return in 2 months.  Melony Overly, PA-C

## 2021-01-29 ENCOUNTER — Encounter: Payer: Self-pay | Admitting: Gastroenterology

## 2021-02-21 ENCOUNTER — Telehealth: Payer: Self-pay | Admitting: Physician Assistant

## 2021-02-21 ENCOUNTER — Other Ambulatory Visit: Payer: Self-pay

## 2021-02-21 MED ORDER — SERTRALINE HCL 100 MG PO TABS: 200.0000 mg | ORAL_TABLET | Freq: Every day | ORAL | 0 refills | Status: DC

## 2021-02-21 NOTE — Telephone Encounter (Signed)
Contacted pharmacy and they informed me insurance kicked it back due to her DOB being listed incorrectly,LVM to call back and discuss

## 2021-02-21 NOTE — Telephone Encounter (Signed)
Spoke to pharmacy pt is able to get Rx now

## 2021-02-21 NOTE — Telephone Encounter (Signed)
Pt called in stating that her dosage for Zoloft was upped to 200mg . She spoke with pharmacy and was told it was to soon to fill. She has been out since Friday and is having withdrawals and feeling "sick." Pls RTC [phone]. Appt 8/1. Pharmacy [facility] [address] [place], [place].

## 2021-03-02 ENCOUNTER — Ambulatory Visit: Payer: No Typology Code available for payment source | Admitting: Gastroenterology

## 2021-03-19 ENCOUNTER — Telehealth (INDEPENDENT_AMBULATORY_CARE_PROVIDER_SITE_OTHER): Payer: No Typology Code available for payment source | Admitting: Physician Assistant

## 2021-03-19 ENCOUNTER — Encounter: Payer: Self-pay | Admitting: Physician Assistant

## 2021-03-19 DIAGNOSIS — Z87891 Personal history of nicotine dependence: Secondary | ICD-10-CM

## 2021-03-19 DIAGNOSIS — F411 Generalized anxiety disorder: Secondary | ICD-10-CM | POA: Diagnosis not present

## 2021-03-19 DIAGNOSIS — G4700 Insomnia, unspecified: Secondary | ICD-10-CM

## 2021-03-19 DIAGNOSIS — F331 Major depressive disorder, recurrent, moderate: Secondary | ICD-10-CM | POA: Diagnosis not present

## 2021-03-19 MED ORDER — BUPROPION HCL ER (XL) 300 MG PO TB24
300.0000 mg | ORAL_TABLET | Freq: Every day | ORAL | 1 refills | Status: DC
Start: 2021-03-19 — End: 2021-05-21

## 2021-03-19 MED ORDER — LORAZEPAM 1 MG PO TABS: 1.0000 mg | ORAL_TABLET | Freq: Three times a day (TID) | ORAL | 1 refills | Status: DC | PRN

## 2021-03-19 NOTE — Progress Notes (Signed)
[facility]  Patient ID: Victoria Mendez,  MRN: [id number]  PCP: Toma Deiters, MD  Date of Evaluation: 03/19/2021 Time spent:25 minutes  Chief Complaint:  Chief Complaint   Anxiety; Depression; Follow-up     Virtual Visit via Telehealth  I connected with patient by telephone, with their informed consent, and verified patient privacy and that I am speaking with the correct person using two identifiers.  I am private, in my office and the patient is at home.  I discussed the limitations, risks, security and privacy concerns of performing an evaluation and management service by telephone and the availability of in person appointments. I also discussed with the patient that there may be a patient responsible charge related to this service. The patient expressed understanding and agreed to proceed.   I discussed the assessment and treatment plan with the patient. The patient was provided an opportunity to ask questions and all were answered. The patient agreed with the plan and demonstrated an understanding of the instructions.   The patient was advised to call back or seek an in-person evaluation if the symptoms worsen or if the condition fails to improve as anticipated.  I provided 25  minutes of non-face-to-face time during this encounter.  HISTORY/CURRENT STATUS: HPI overdue for routine med check.  Quit smoking 2 weeks ago! Still using nicotine losenges. Still has cravings.  Has been dx with central sleep apnea. Hasn't gotten the CPAP yet. Another option would be to have surgery but she does not want that.  She's afraid to go to sleep sometimes, she worries that she won't be able to wake up.  Has decreased concentration, probably worse since she stopped smoking. Wonders if increasing the Wellbutrin could help.  Work is going well but because of not been able to focus as well it makes her nervous that she may miss something at work.  Does still have a lot of anxiety.   Especially with current circumstances.  Ativan is helpful at the current dose.  At the last visit around 2 months ago we increased the Zoloft and that has helped with the depression and anxiety.  Patient denies loss of interest in usual activities and is able to enjoy things.  Denies decreased energy or motivation.  Appetite has not changed.  No extreme sadness, tearfulness, or feelings of hopelessness. Denies suicidal or homicidal thoughts.  Patient denies increased energy with decreased need for sleep, no increased talkativeness, no racing thoughts, no impulsivity or risky behaviors, no increased spending, no increased libido, no grandiosity, no increased irritability or anger, and no hallucinations.  Denies dizziness, syncope, seizures, numbness, tingling, tremor, tics, unsteady gait, slurred speech, confusion. Denies muscle or joint pain, stiffness, or dystonia.  Individual Medical History/ Review of Systems: Changes? :Yes diagnosed with central sleep apnea.  Past medications for mental health diagnoses include: Prozac, Pristiq, Ativan, Buspar, Wellbutrin, Hydroxyzine  Allergies: Penicillins  Current Medications:  Current Outpatient Medications:    buPROPion (WELLBUTRIN XL) 300 MG 24 hr tablet, Take 1 tablet (300 mg total) by mouth daily., Disp: 30 tablet, Rfl: 1   cholecalciferol (VITAMIN D3) 25 MCG (1000 UNIT) tablet, Take 1,000 Units by mouth daily., Disp: , Rfl:    esomeprazole (NEXIUM) 40 MG capsule, Take 40 mg by mouth daily at 12 noon., Disp: , Rfl:    nicotine polacrilex (NICORETTE) 4 MG gum, Take 4 mg by mouth as needed for smoking cessation., Disp: , Rfl:    sertraline (ZOLOFT) 100 MG tablet, Take 2  tablets (200 mg total) by mouth daily., Disp: 180 tablet, Rfl: 0   zinc gluconate 50 MG tablet, Take 50 mg by mouth daily., Disp: , Rfl:    Ascorbic Acid (VITAMIN C) 100 MG tablet, Take 100 mg by mouth daily. (Patient not taking: No sig reported), Disp: , Rfl:    LORazepam (ATIVAN) 1  MG tablet, Take 1 tablet (1 mg total) by mouth every 8 (eight) hours as needed for anxiety., Disp: 75 tablet, Rfl: 1 Medication Side Effects: none  Family Medical/ Social History: Changes? No   MENTAL HEALTH EXAM:  There were no vitals taken for this visit.There is no height or weight on file to calculate BMI.  General Appearance:  Unable to assess  Eye Contact:   Unable to assess  Speech:  Clear and Coherent and Normal Rate  Volume:  Normal  Mood:  Anxious  Affect:   Unable to assess  Thought Process:  Goal Directed and Descriptions of Associations: Circumstantial  Orientation:  Full (Time, Place, and Person)  Thought Content: Obsessions and Rumination   Suicidal Thoughts:  No  Homicidal Thoughts:  No  Memory:  WNL  Judgement:  Good  Insight:  Good  Psychomotor Activity:   Unable to assess  Concentration:  Concentration: Fair and Attention Span: Fair  Recall:  Good  Fund of Knowledge: Good  Language: Good  Assets:  Desire for Improvement  ADL's:  Intact  Cognition: WNL  Prognosis:  Good    DIAGNOSES:    ICD-10-CM   1. Major depressive disorder, recurrent episode, moderate (HCC)  F33.1     2. Generalized anxiety disorder  F41.1     3. Insomnia, unspecified type  G47.00     4. Former smoker  Z87.891        Receiving Psychotherapy: No    RECOMMENDATIONS:  PDMP was reviewed. Ativan filled 02/20/2021. I provided 25 minutes of non-face-to-face time during this encounter, including time spent before and after the visit in records review, medical decision making, and charting.  Congratulations on smoking cessation!  I hope she will be able to get off the nicotine mints sometime soon but if not is still better than smoking.  I do recommend increasing the Wellbutrin as that will probably be helpful with cravings.  And it can also help with concentration.  She would like to increase it. Increase Wellbutrin XL to 300 mg, 1 p.o. every morning. Continue Ativan 1 mg, 1/2-1 p.o.  3 times daily as needed anxiety.  Take as sparingly as possible and recommend she not take it within 2 hours of bedtime because of the sleep apnea, at least until she gets the CPAP. Continue Zoloft 100 mg 2 pills daily.. Return in 2 months.  Melony Overly, PA-C

## 2021-04-22 ENCOUNTER — Other Ambulatory Visit: Payer: Self-pay | Admitting: Physician Assistant

## 2021-05-21 ENCOUNTER — Other Ambulatory Visit: Payer: Self-pay | Admitting: Physician Assistant

## 2021-05-23 ENCOUNTER — Encounter: Payer: Self-pay | Admitting: Internal Medicine

## 2021-05-23 ENCOUNTER — Ambulatory Visit: Payer: No Typology Code available for payment source | Admitting: Gastroenterology

## 2021-05-25 ENCOUNTER — Other Ambulatory Visit: Payer: Self-pay | Admitting: Physician Assistant

## 2021-05-29 NOTE — Telephone Encounter (Signed)
Please schedule appt

## 2021-05-30 NOTE — Telephone Encounter (Signed)
Called pt and LVM

## 2021-05-31 NOTE — Telephone Encounter (Signed)
Called another number [phone] in pt record and not able to leave voice mail

## 2021-06-11 ENCOUNTER — Encounter: Payer: Self-pay | Admitting: Physician Assistant

## 2021-06-11 ENCOUNTER — Telehealth (INDEPENDENT_AMBULATORY_CARE_PROVIDER_SITE_OTHER): Payer: No Typology Code available for payment source | Admitting: Physician Assistant

## 2021-06-11 DIAGNOSIS — F3342 Major depressive disorder, recurrent, in full remission: Secondary | ICD-10-CM

## 2021-06-11 DIAGNOSIS — F17200 Nicotine dependence, unspecified, uncomplicated: Secondary | ICD-10-CM | POA: Diagnosis not present

## 2021-06-11 DIAGNOSIS — F4010 Social phobia, unspecified: Secondary | ICD-10-CM

## 2021-06-11 DIAGNOSIS — F411 Generalized anxiety disorder: Secondary | ICD-10-CM

## 2021-06-11 MED ORDER — LORAZEPAM 1 MG PO TABS: 1.0000 mg | ORAL_TABLET | Freq: Three times a day (TID) | ORAL | 5 refills | Status: DC | PRN

## 2021-06-11 MED ORDER — BUPROPION HCL ER (XL) 150 MG PO TB24: 150.0000 mg | ORAL_TABLET | Freq: Every day | ORAL | 1 refills | Status: DC

## 2021-06-11 MED ORDER — SERTRALINE HCL 100 MG PO TABS: ORAL_TABLET | ORAL | 1 refills | Status: DC

## 2021-06-11 NOTE — Progress Notes (Signed)
[facility]  Patient ID: Victoria Mendez,  MRN: [id number]  PCP: Toma Deiters, MD  Date of Evaluation: 06/11/2021 Time spent:20 minutes  Chief Complaint:  Chief Complaint   Anxiety; Depression; Follow-up    Virtual Visit via Telehealth  I connected with patient by a video enabled telemedicine application with their informed consent, and verified patient privacy and that I am speaking with the correct person using two identifiers.  I am private, in my office and the patient is at home work.  I discussed the limitations, risks, security and privacy concerns of performing an evaluation and management service by video and the availability of in person appointments. I also discussed with the patient that there may be a patient responsible charge related to this service. The patient expressed understanding and agreed to proceed.   I discussed the assessment and treatment plan with the patient. The patient was provided an opportunity to ask questions and all were answered. The patient agreed with the plan and demonstrated an understanding of the instructions.   The patient was advised to call back or seek an in-person evaluation if the symptoms worsen or if the condition fails to improve as anticipated.  I provided 20  minutes of non-face-to-face time during this encounter.  HISTORY/CURRENT STATUS: HPI for routine med check.  At the last visit we increased the Wellbutrin hoping to give her more energy and motivation but it actually backfired and made her cry more.  She decreased the dose back to 150 mg.  States she is doing very well on this dose.  She is able to enjoy things. Denies decreased energy or motivation.  Appetite has not changed.  No extreme sadness, tearfulness, or feelings of hopelessness. Denies suicidal or homicidal thoughts.  Does still have anxiety requiring Ativan daily.  Usually always takes the Ativan twice a day and occasionally has to take it 3 times  daily but not commonly.  She is not having panic attacks as much is just generalized anxiety.  Sleeping better with CPAP.  She started back smoking again.  Not ready to quit.  Patient denies increased energy with decreased need for sleep, no increased talkativeness, no racing thoughts, no impulsivity or risky behaviors, no increased spending, no increased libido, no grandiosity, no increased irritability or anger, and no hallucinations.  Denies dizziness, syncope, seizures, numbness, tingling, tremor, tics, unsteady gait, slurred speech, confusion. Denies muscle or joint pain, stiffness, or dystonia.  Individual Medical History/ Review of Systems: Changes? :No    Past medications for mental health diagnoses include: Prozac, Pristiq, Ativan, Buspar, Wellbutrin, Hydroxyzine  Allergies: Penicillins  Current Medications:  Current Outpatient Medications:    buPROPion (WELLBUTRIN XL) 150 MG 24 hr tablet, Take 1 tablet (150 mg total) by mouth daily., Disp: 90 tablet, Rfl: 1   cholecalciferol (VITAMIN D3) 25 MCG (1000 UNIT) tablet, Take 1,000 Units by mouth daily., Disp: , Rfl:    esomeprazole (NEXIUM) 40 MG capsule, Take 40 mg by mouth daily at 12 noon., Disp: , Rfl:    nicotine polacrilex (NICORETTE) 4 MG gum, Take 4 mg by mouth as needed for smoking cessation., Disp: , Rfl:    Ascorbic Acid (VITAMIN C) 100 MG tablet, Take 100 mg by mouth daily. (Patient not taking: No sig reported), Disp: , Rfl:    LORazepam (ATIVAN) 1 MG tablet, Take 1 tablet (1 mg total) by mouth every 8 (eight) hours as needed for anxiety., Disp: 75 tablet, Rfl: 5   sertraline (ZOLOFT) 100  MG tablet, TAKE 2 TABLETS(200 MG) BY MOUTH DAILY, Disp: 180 tablet, Rfl: 1   zinc gluconate 50 MG tablet, Take 50 mg by mouth daily. (Patient not taking: Reported on 06/11/2021), Disp: , Rfl:  Medication Side Effects: none  Family Medical/ Social History: Changes? No   MENTAL HEALTH EXAM:  There were no vitals taken for this  visit.There is no height or weight on file to calculate BMI.  General Appearance: Casual and Well Groomed  Eye Contact:  Good  Speech:  Clear and Coherent and Normal Rate  Volume:  Normal  Mood:  Euthymic  Affect:  Appropriate  Thought Process:  Goal Directed and Descriptions of Associations: Circumstantial  Orientation:  Full (Time, Place, and Person)  Thought Content: Logical   Suicidal Thoughts:  No  Homicidal Thoughts:  No  Memory:  WNL  Judgement:  Good  Insight:  Good  Psychomotor Activity:  Normal  Concentration:  Concentration: Good and Attention Span: Good  Recall:  Good  Fund of Knowledge: Good  Language: Good  Assets:  Desire for Improvement  ADL's:  Intact  Cognition: WNL  Prognosis:  Good    DIAGNOSES:    ICD-10-CM   1. Major depression, recurrent, full remission (HCC)  F33.42     2. Generalized anxiety disorder  F41.1     3. Social anxiety disorder  F40.10     4. Smoker  F17.200         Receiving Psychotherapy: No    RECOMMENDATIONS:  PDMP was reviewed. Ativan filled 05/15/2021.   I provided 20 minutes of non-face-to-face time during this encounter, including time spent before and after the visit in records review, medical decision making, and charting.  Smoking cessation discussed.  She is not ready to quit. She is doing well so no changes in meds are necessary. Continue Wellbutrin XL 150 mg p.o. daily. Continue Ativan 1 mg, 1/2-1 p.o. 3 times daily as needed anxiety.  Continue Zoloft 100 mg 2 pills daily.. Return in 6 months.  Melony Overly, PA-C

## 2021-06-30 ENCOUNTER — Other Ambulatory Visit: Payer: Self-pay | Admitting: Physician Assistant

## 2021-09-05 ENCOUNTER — Other Ambulatory Visit: Payer: Self-pay | Admitting: Physician Assistant

## 2021-09-06 NOTE — Telephone Encounter (Signed)
Filled 9/27.

## 2021-09-07 NOTE — Telephone Encounter (Signed)
Last filled 12/27

## 2021-10-22 ENCOUNTER — Other Ambulatory Visit: Payer: Self-pay | Admitting: Physician Assistant

## 2021-11-20 ENCOUNTER — Other Ambulatory Visit: Payer: Self-pay | Admitting: Physician Assistant

## 2021-11-22 NOTE — Telephone Encounter (Signed)
Please call to schedule an appt. Last seen 10/24 with RTC in 6 months, due now.  ?

## 2021-12-11 ENCOUNTER — Telehealth (INDEPENDENT_AMBULATORY_CARE_PROVIDER_SITE_OTHER): Payer: Self-pay | Admitting: Physician Assistant

## 2021-12-11 DIAGNOSIS — Z91199 Patient's noncompliance with other medical treatment and regimen due to unspecified reason: Secondary | ICD-10-CM

## 2021-12-11 NOTE — Progress Notes (Signed)
No show for appointment. Patient was not on video, I called 3 different times, leaving a message all 3 times that I was trying to reach her for appointment.  At 9:45 and left a message asking her to call the office and reschedule. ?  ?

## 2021-12-24 ENCOUNTER — Ambulatory Visit: Payer: No Typology Code available for payment source | Admitting: Physician Assistant

## 2022-01-07 ENCOUNTER — Other Ambulatory Visit: Payer: Self-pay | Admitting: Physician Assistant

## 2022-01-09 ENCOUNTER — Other Ambulatory Visit: Payer: Self-pay

## 2022-01-09 NOTE — Telephone Encounter (Signed)
No show on 12/10/21 Last apt 05/2021  Has upcoming apt in June

## 2022-02-08 ENCOUNTER — Encounter: Payer: Self-pay | Admitting: Physician Assistant

## 2022-02-08 ENCOUNTER — Telehealth (INDEPENDENT_AMBULATORY_CARE_PROVIDER_SITE_OTHER): Payer: 59 | Admitting: Physician Assistant

## 2022-02-08 DIAGNOSIS — Z87891 Personal history of nicotine dependence: Secondary | ICD-10-CM | POA: Diagnosis not present

## 2022-02-08 DIAGNOSIS — F3342 Major depressive disorder, recurrent, in full remission: Secondary | ICD-10-CM

## 2022-02-08 DIAGNOSIS — F411 Generalized anxiety disorder: Secondary | ICD-10-CM | POA: Diagnosis not present

## 2022-02-08 MED ORDER — LORAZEPAM 1 MG PO TABS
ORAL_TABLET | ORAL | 5 refills | Status: DC
Start: 2022-02-08 — End: 2022-05-15

## 2022-02-08 MED ORDER — SERTRALINE HCL 100 MG PO TABS: 200.0000 mg | ORAL_TABLET | Freq: Every day | ORAL | 1 refills | Status: DC

## 2022-05-15 ENCOUNTER — Telehealth: Payer: Self-pay | Admitting: Physician Assistant

## 2022-05-15 ENCOUNTER — Encounter: Payer: Self-pay | Admitting: Physician Assistant

## 2022-05-15 ENCOUNTER — Telehealth (INDEPENDENT_AMBULATORY_CARE_PROVIDER_SITE_OTHER): Payer: Self-pay | Admitting: Physician Assistant

## 2022-05-15 DIAGNOSIS — F411 Generalized anxiety disorder: Secondary | ICD-10-CM

## 2022-05-15 DIAGNOSIS — F329 Major depressive disorder, single episode, unspecified: Secondary | ICD-10-CM

## 2022-05-15 DIAGNOSIS — F17200 Nicotine dependence, unspecified, uncomplicated: Secondary | ICD-10-CM

## 2022-05-15 MED ORDER — SERTRALINE HCL 100 MG PO TABS: 200.0000 mg | ORAL_TABLET | Freq: Every day | ORAL | 3 refills | Status: DC

## 2022-05-15 MED ORDER — LORAZEPAM 1 MG PO TABS
ORAL_TABLET | ORAL | 5 refills | Status: DC
Start: 2022-05-15 — End: 2022-09-17

## 2022-05-15 NOTE — Progress Notes (Signed)
[facility]  Patient ID: Victoria Mendez,  MRN: [id number]  PCP: Toma Deiters, MD  Date of Evaluation: 05/15/2022 Time spent:30 minutes  Chief Complaint:  Chief Complaint   Anxiety; Depression; Follow-up    Virtual Visit via Telehealth  I connected with patient by a video enabled telemedicine application with their informed consent, and verified patient privacy and that I am speaking with the correct person using two identifiers.  I am private, in my office and the patient is at home.  I discussed the limitations, risks, security and privacy concerns of performing an evaluation and management service by video and the availability of in person appointments. I also discussed with the patient that there may be a patient responsible charge related to this service. The patient expressed understanding and agreed to proceed.   I discussed the assessment and treatment plan with the patient. The patient was provided an opportunity to ask questions and all were answered. The patient agreed with the plan and demonstrated an understanding of the instructions.   The patient was advised to call back or seek an in-person evaluation if the symptoms worsen or if the condition fails to improve as anticipated.  I provided  30  minutes of non-face-to-face time during this encounter.  HISTORY/CURRENT STATUS: HPI for routine med check.  Wants to go off Ativan and try IV ketamine.  She understands that ketamine is for treatment resistant depression, not anxiety.  She is tired of living like she is though, she has no joy, feels hopeless, energy and motivation are low.  Appetite is normal and weight is stable.  ADLs and personal hygiene are normal.  She has depression in her family and does not want to keep living like this although she has no suicidal or homicidal thoughts.  She is sometimes having to take more Ativan to help with the anxiety.  She always takes 2/day and maybe 4 or 5 times she  has had to take it 3 times a day.  States she understands that the body becomes tolerant to it, and an increased dose is needed to have the same effect.  "I just do not want to go down that road.  Not having panic attacks.  Life has been stressful because of her loss of job and finding a new one.  Patient denies increased energy with decreased need for sleep, increased talkativeness, racing thoughts, impulsivity or risky behaviors, increased spending, increased libido, grandiosity, increased irritability or anger, paranoia, or hallucinations.  Denies dizziness, syncope, seizures, numbness, tingling, tremor, tics, unsteady gait, slurred speech, confusion. Denies muscle or joint pain, stiffness, or dystonia.  Individual Medical History/ Review of Systems: Changes? :No   Past medications for mental health diagnoses include: Prozac, Pristiq, Ativan, Buspar, Wellbutrin (over 150 mg caused her to cry more)  Hydroxyzine  Allergies: Penicillins  Current Medications:  Current Outpatient Medications:    esomeprazole (NEXIUM) 40 MG capsule, Take 40 mg by mouth daily at 12 noon., Disp: , Rfl:    Ascorbic Acid (VITAMIN C) 100 MG tablet, Take 100 mg by mouth daily. (Patient not taking: Reported on 03/03/2020), Disp: , Rfl:    cholecalciferol (VITAMIN D3) 25 MCG (1000 UNIT) tablet, Take 1,000 Units by mouth daily. (Patient not taking: Reported on 02/08/2022), Disp: , Rfl:    Cyanocobalamin (B-12 COMPLIANCE INJECTION IJ), Inject as directed. (Patient not taking: Reported on 05/15/2022), Disp: , Rfl:    LORazepam (ATIVAN) 1 MG tablet, Decrease daily dose by 0.25 mg, for 2  weeks at a time, to wean completely off in 3 to 3.5 months., Disp: 98 tablet, Rfl: 5   Multiple Vitamin (MULTIVITAMIN) capsule, Take 1 capsule by mouth daily. (Patient not taking: Reported on 05/15/2022), Disp: , Rfl:    nicotine polacrilex (NICORETTE) 4 MG gum, Take 4 mg by mouth as needed for smoking cessation. (Patient not taking: Reported on  05/15/2022), Disp: , Rfl:    sertraline (ZOLOFT) 100 MG tablet, Take 2 tablets (200 mg total) by mouth daily., Disp: 180 tablet, Rfl: 3   zinc gluconate 50 MG tablet, Take 50 mg by mouth daily. (Patient not taking: Reported on 06/11/2021), Disp: , Rfl:  Medication Side Effects: none  Family Medical/ Social History: Changes? 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   MENTAL HEALTH EXAM:  There were no vitals taken for this visit.There is no height or weight on file to calculate BMI.  General Appearance: Casual and Well Groomed  Eye Contact:  Good  Speech:  Clear and Coherent and Normal Rate  Volume:  Normal  Mood:   sad  Affect:  Congruent  Thought Process:  Goal Directed and Descriptions of Associations: Circumstantial  Orientation:  Full (Time, Place, and Person)  Thought Content: Logical   Suicidal Thoughts:  No  Homicidal Thoughts:  No  Memory:  WNL  Judgement:  Good  Insight:  Good  Psychomotor Activity:  Normal  Concentration:  Concentration: Good and Attention Span: Good  Recall:  Good  Fund of Knowledge: Good  Language: Good  Assets:  Desire for Improvement  ADL's:  Intact  Cognition: WNL  Prognosis:  Good   DIAGNOSES:    ICD-10-CM   1. Treatment-resistant depression  F32.9     2. Generalized anxiety disorder  F41.1     3. Smoker  F17.200       Receiving Psychotherapy: No   RECOMMENDATIONS:  PDMP was reviewed. Ativan filled 04/20/2022.   I provided 30 minutes of non-face-to-face time during this encounter, including time spent before and after the visit in records review, medical decision making, counseling pertinent to today's visit, and charting.   We discussed the IV ketamine.  It is not FDA approved for depression, therefore I cannot condone it, however we did discuss Spravato.  Benefits, risks and side effects were discussed, she prefers to go to a  ketamine clinic that she has already made an appointment with in [place] and try that route first.  If she prefers to try the Spravato she can let me know and she can have treatments done here as long as her insurance approves it.  She understands it would be twice a week treatment, 2 hours at least, twice a week for 1 month, then once a week for 1 month and then may decrease to 1 treatment every other week.  Weaning off the Ativan is appropriate.  Will cancel her refills at [facility] and I will send in 1 prescription for Ativan to her new [employer] pharmacy so she can continue a slow wean.  She understands.  If anxiety worsens, she can let me know and will decide how to treat then.  Recommend she stay on the Zoloft because it has helped with depression.  It is a requirement to be on an antidepressant if we start Spravato.  She understands.  Wean off Ativan as follows.  Decrease to 1.75 mg  divided throughout the day for 2 weeks then 1.5 mg divided daily for 2 weeks, then 1.  To 5 mg daily divided for 2 weeks, then 1 mg daily for 2 weeks, then 0.75 mg daily for 2 weeks, 0.5 mg daily for 2 weeks, then 0.25 mg daily for 2 weeks and then stop.  She understands this may be slower than needed and if she desires she can go do it 1 week at a time.  She prefers to go slow like this. Continue Zoloft 100 mg 2 pills daily.. Return in 3 to 4 months.  Melony Overly, PA-C

## 2022-05-15 NOTE — Telephone Encounter (Signed)
This prescription is confusing, I agree.   Please call them back and let them know I am weaning her off Ativan very slowly.  She has 1 mg pills and she will take a total of 1.75 mg/day for 2 weeks, then 1.5 mg/day for 2 weeks, then 1.25 mg daily for 2 weeks, then 1 mg daily for 2 weeks, then 0.75 mg daily for 2 weeks, 0.5 mg daily for 2 weeks, then 0.25 mg daily for 2 weeks and then stop.  Patient knows not to take the entire dose at the same time when she is above 1 mg total dose for the day. She states she can divide the pills into 1/4, so does not need for me to send in a smaller dose. Thank you.

## 2022-05-15 NOTE — Telephone Encounter (Signed)
[facility] has called twice and I called them back once and left them a message.  Last phone call was at 2:19 which Mickel Baas took the message for.  They have said that the script for Ativan for the pt is not clear enough for the insurance.  It does not say how long the medication is supposed to last.  I lvm for them reading what the script said, "Decrease daily dose by 0.25 mg, for 2 weeks at a time, to wean completely off in 3 to 3.5 months."  I don't know if that's still not specific enough or if they haven't heard the vm, but I tried calling back and was on hold for about 17mins and then had to hang up.  I assume they still need a call back.

## 2022-05-15 NOTE — Telephone Encounter (Signed)
Please see note from pharmacy

## 2022-05-16 NOTE — Telephone Encounter (Signed)
Called pharmacy and corrected the Rx to read no refills.

## 2022-08-09 ENCOUNTER — Telehealth (INDEPENDENT_AMBULATORY_CARE_PROVIDER_SITE_OTHER): Payer: No Typology Code available for payment source | Admitting: Physician Assistant

## 2022-08-09 ENCOUNTER — Encounter: Payer: Self-pay | Admitting: Physician Assistant

## 2022-08-09 DIAGNOSIS — F329 Major depressive disorder, single episode, unspecified: Secondary | ICD-10-CM | POA: Diagnosis not present

## 2022-08-09 DIAGNOSIS — F411 Generalized anxiety disorder: Secondary | ICD-10-CM

## 2022-08-09 MED ORDER — B COMPLEX VITAMINS PO CAPS: 1.0000 | ORAL_CAPSULE | Freq: Every day | ORAL | Status: DC

## 2022-08-09 NOTE — Progress Notes (Unsigned)
[facility]  Patient ID: Victoria Mendez,  MRN: [id number]  PCP: Toma Deiters, MD  Date of Evaluation: 08/09/2022 Time spent:30 minutes  Chief Complaint:  Chief Complaint   Anxiety; Depression; Follow-up   Virtual Visit via Telehealth  I connected with patient by a video enabled telemedicine application with their informed consent, and verified patient privacy and that I am speaking with the correct person using two identifiers.  I am private, in my office and the patient is at home.  I discussed the limitations, risks, security and privacy concerns of performing an evaluation and management service by telephone video and the availability of in person appointments. I also discussed with the patient that there may be a patient responsible charge related to this service. The patient expressed understanding and agreed to proceed.   I discussed the assessment and treatment plan with the patient. The patient was provided an opportunity to ask questions and all were answered. The patient agreed with the plan and demonstrated an understanding of the instructions.   The patient was advised to call back or seek an in-person evaluation if the symptoms worsen or if the condition fails to improve as anticipated.  I provided  30  minutes of non-face-to-face time during this encounter.  HISTORY/CURRENT STATUS: HPI for routine med check.  Cont to feel depressed, never goes out of her house. Has been months. Family takes care of shopping. Has suffered from depression for years and she wonders if there's something else she can do.  She does some things around the house but just what she can get by with. Personal hygiene is decreased, may go a few days without a shower. Energy and motivation are low.Sleeps more than she should and doesn't feel rested.  Appetite nl. Cries easily. Hard time staying focused sometimes. Is able to work but doesn't have energy for much else. 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 No SI/HI.  Has been gradually weaning off Ativan for 3 months now. Doesn't want to need it or depend on it. Not having PA. Still gets overwhelmed depending on the situation, but able to push through it.   Patient denies increased energy with decreased need for sleep, increased talkativeness, racing thoughts, impulsivity or risky behaviors, increased spending, increased libido, grandiosity, increased irritability or anger, paranoia, or hallucinations.  Denies dizziness, syncope, seizures, numbness, tingling, tremor, tics, unsteady gait, slurred speech, confusion. Denies muscle or joint pain, stiffness, or dystonia.  Individual Medical History/ Review of Systems: Changes? :No   Past medications for mental health diagnoses include: Prozac, Pristiq, Ativan, Buspar, Wellbutrin (over 150 mg caused her to cry more)  Hydroxyzine, Zoloft  Allergies: Penicillins  Current Medications:  Current Outpatient Medications:    Ascorbic Acid (VITAMIN C) 100 MG tablet, Take 100 mg by mouth daily., Disp: , Rfl:    b complex vitamins capsule, Take 1 capsule by mouth daily., Disp: 30 capsule, Rfl:    cholecalciferol (VITAMIN D3) 25 MCG (1000 UNIT) tablet, Take 1,000 Units by mouth daily., Disp: , Rfl:    esomeprazole (NEXIUM) 40 MG capsule, Take 40 mg by mouth daily at 12 noon., Disp: , Rfl:    LORazepam (ATIVAN) 1 MG tablet, Decrease daily dose by 0.25 mg, for 2 weeks at a time, to wean completely off in 3 to 3.5 months. (Patient taking differently: Take 0.25 mg by mouth at bedtime.), Disp: 98 tablet, Rfl: 5   Multiple Vitamin (MULTIVITAMIN) capsule, Take 1 capsule by mouth daily., Disp: ,  Rfl:    sertraline (ZOLOFT) 100 MG tablet, Take 2 tablets (200 mg total) by mouth daily., Disp: 180 tablet, Rfl: 3   zinc gluconate 50 MG tablet, Take 50 mg by mouth daily., Disp: , Rfl:    Cyanocobalamin (B-12 COMPLIANCE INJECTION IJ), Inject as directed. (Patient not taking: Reported on  05/15/2022), Disp: , Rfl:    nicotine polacrilex (NICORETTE) 4 MG gum, Take 4 mg by mouth as needed for smoking cessation. (Patient not taking: Reported on 05/15/2022), Disp: , Rfl:  Medication Side Effects: none  Family Medical/ Social History: Changes? no  MENTAL HEALTH EXAM:  There were no vitals taken for this visit.There is no height or weight on file to calculate BMI.  General Appearance: Casual and Well Groomed  Eye Contact:  Good  Speech:  Clear and Coherent and Normal Rate  Volume:  Normal  Mood:  Depressed  Affect:  Depressed  Thought Process:  Goal Directed and Descriptions of Associations: Circumstantial  Orientation:  Full (Time, Place, and Person)  Thought Content: Logical   Suicidal Thoughts:  No  Homicidal Thoughts:  No  Memory:  WNL  Judgement:  Good  Insight:  Good  Psychomotor Activity:  Normal  Concentration:  Concentration: Good and Attention Span: Good  Recall:  Good  Fund of Knowledge: Good  Language: Good  Assets:  Desire for Improvement  ADL's:  Intact  Cognition: WNL  Prognosis:  Good   ECT-MADRS    Flowsheet Row Video Visit from 08/09/2022 in [facility]  MADRS Total Score 45       DIAGNOSES:    ICD-10-CM   1. Treatment-resistant depression  F32.9     2. Generalized anxiety disorder  F41.1       Receiving Psychotherapy: No   RECOMMENDATIONS:  PDMP was reviewed. Ativan filled 05/15/2022. I provided 30 minutes of non-face-to-face time during this encounter, including time spent before and after the visit in records review, medical decision making, counseling pertinent to today's visit, and charting.   Long discussion about TRD and options including TMS, ECT, Spravato. Recommend Spravato. Pros and cons discussed, will be in office for observation for 2 hours, can't drive for the remainder of the day. Benefits, risks and SE discussed. Pt wants to pursue this.  I will talk with Napoleon Form OM to get the ball  rolling.  Continue to wean off Ativan 1 mg, by taking 1/4 po qd for 1 more week, then stop. Continue Zoloft 100 mg 2 pills daily.. Return in 4-6 wks, sooner if Spravato can be started before then.  Melony Overly, PA-C

## 2022-08-15 ENCOUNTER — Encounter: Payer: Self-pay | Admitting: Physician Assistant

## 2022-09-14 ENCOUNTER — Other Ambulatory Visit: Payer: Self-pay | Admitting: Physician Assistant

## 2022-09-14 NOTE — Telephone Encounter (Signed)
Was supposed to wean off, verify if she did.

## 2022-09-16 NOTE — Telephone Encounter (Signed)
Primary # does not have VM set up. Called the other # listed for her and LVM to [facility].

## 2022-09-17 ENCOUNTER — Telehealth (INDEPENDENT_AMBULATORY_CARE_PROVIDER_SITE_OTHER): Payer: No Typology Code available for payment source | Admitting: Physician Assistant

## 2022-09-17 ENCOUNTER — Encounter: Payer: Self-pay | Admitting: Physician Assistant

## 2022-09-17 DIAGNOSIS — F329 Major depressive disorder, single episode, unspecified: Secondary | ICD-10-CM

## 2022-09-17 DIAGNOSIS — F411 Generalized anxiety disorder: Secondary | ICD-10-CM

## 2022-09-17 DIAGNOSIS — F4010 Social phobia, unspecified: Secondary | ICD-10-CM | POA: Diagnosis not present

## 2022-09-17 DIAGNOSIS — G4700 Insomnia, unspecified: Secondary | ICD-10-CM

## 2022-09-17 DIAGNOSIS — Z87891 Personal history of nicotine dependence: Secondary | ICD-10-CM

## 2022-09-17 DIAGNOSIS — F17211 Nicotine dependence, cigarettes, in remission: Secondary | ICD-10-CM

## 2022-09-17 MED ORDER — LORAZEPAM 1 MG PO TABS: 1.0000 mg | ORAL_TABLET | Freq: Every day | ORAL | 1 refills | Status: DC

## 2022-09-17 MED ORDER — AUVELITY 45-105 MG PO TBCR: 1.0000 | EXTENDED_RELEASE_TABLET | Freq: Two times a day (BID) | ORAL | 1 refills | Status: DC

## 2022-09-17 NOTE — Progress Notes (Signed)
[facility]  Patient ID: Victoria Mendez,  MRN: [id number]  PCP: Neale Burly, MD  Date of Evaluation: 09/17/2022 Time spent:20 minutes  Chief Complaint:  Chief Complaint   Anxiety; Depression; Follow-up   Virtual Visit via Telehealth  I connected with patient by a video enabled telemedicine application with their informed consent, and verified patient privacy and that I am speaking with the correct person using two identifiers.  I am private, in my office and the patient is at home.  I discussed the limitations, risks, security and privacy concerns of performing an evaluation and management service by video and the availability of in person appointments. I also discussed with the patient that there may be a patient responsible charge related to this service. The patient expressed understanding and agreed to proceed.   I discussed the assessment and treatment plan with the patient. The patient was provided an opportunity to ask questions and all were answered. The patient agreed with the plan and demonstrated an understanding of the instructions.   The patient was advised to call back or seek an in-person evaluation if the symptoms worsen or if the condition fails to improve as anticipated.  I provided 20 minutes of non-face-to-face time during this encounter.  HISTORY/CURRENT STATUS: HPI for routine med check.  Didn't tolerate any further decrease of Ativan. She started having severe panic attacks again so she went back on it. At night only. It's helped a lot, lasts the whole day. No PA now.   Asks about Auvelity.  "It's all over social media."  We discussed [place] at [place]. She decided she doesn't want to take it. Doesn't want to be out of control for any length of time. Still depressed, anhedonia, works but doesn't have much energy for anything else. Feels sad for no reason.  Sleeps well with the Ativan. ADLs and personal hygiene are normal.   Denies any changes in  concentration, making decisions, or remembering things.  Appetite has not changed.  Weight is stable.  Denies suicidal or homicidal thoughts.  Patient denies increased energy with decreased need for sleep, increased talkativeness, racing thoughts, impulsivity or risky behaviors, increased spending, increased libido, grandiosity, increased irritability or anger, paranoia, or hallucinations.  Denies dizziness, syncope, seizures, numbness, tingling, tremor, tics, unsteady gait, slurred speech, confusion. Denies muscle or joint pain, stiffness, or dystonia.  Individual Medical History/ Review of Systems: Changes? :No   Past medications for mental health diagnoses include: Prozac, Pristiq, Ativan, Buspar, Wellbutrin (over 150 mg caused her to cry more)  Hydroxyzine, Zoloft  Allergies: Penicillins  Current Medications:  Current Outpatient Medications:    b complex vitamins capsule, Take 1 capsule by mouth daily., Disp: 30 capsule, Rfl:    cholecalciferol (VITAMIN D3) 25 MCG (1000 UNIT) tablet, Take 1,000 Units by mouth daily., Disp: , Rfl:    Dextromethorphan-buPROPion ER (AUVELITY) 45-105 MG TBCR, Take 1 tablet by mouth 2 (two) times daily., Disp: 60 tablet, Rfl: 1   esomeprazole (NEXIUM) 40 MG capsule, Take 40 mg by mouth daily at 12 noon., Disp: , Rfl:    Multiple Vitamin (MULTIVITAMIN) capsule, Take 1 capsule by mouth daily., Disp: , Rfl:    sertraline (ZOLOFT) 100 MG tablet, Take 2 tablets (200 mg total) by mouth daily., Disp: 180 tablet, Rfl: 3   Ascorbic Acid (VITAMIN C) 100 MG tablet, Take 100 mg by mouth daily. (Patient not taking: Reported on 09/17/2022), Disp: , Rfl:    Cyanocobalamin (B-12 COMPLIANCE INJECTION IJ), Inject as directed. (  Patient not taking: Reported on 05/15/2022), Disp: , Rfl:    LORazepam (ATIVAN) 1 MG tablet, Take 1 tablet (1 mg total) by mouth at bedtime., Disp: 90 tablet, Rfl: 1   nicotine polacrilex (NICORETTE) 4 MG gum, Take 4 mg by mouth as needed for smoking  cessation. (Patient not taking: Reported on 05/15/2022), Disp: , Rfl:    zinc gluconate 50 MG tablet, Take 50 mg by mouth daily. (Patient not taking: Reported on 09/17/2022), Disp: , Rfl:  Medication Side Effects: none  Family Medical/ Social History: Changes? no  MENTAL HEALTH EXAM:  There were no vitals taken for this visit.There is no height or weight on file to calculate BMI.  General Appearance: Casual and Well Groomed  Eye Contact:  Good  Speech:  Clear and Coherent and Normal Rate  Volume:  Normal  Mood:   sad  Affect:  Congruent  Thought Process:  Goal Directed and Descriptions of Associations: Circumstantial  Orientation:  Full (Time, Place, and Person)  Thought Content: Logical   Suicidal Thoughts:  No  Homicidal Thoughts:  No  Memory:  WNL  Judgement:  Good  Insight:  Good  Psychomotor Activity:  Normal  Concentration:  Concentration: Good and Attention Span: Good  Recall:  Good  Fund of Knowledge: Good  Language: Good  Assets:  Desire for Improvement  ADL's:  Intact  Cognition: WNL  Prognosis:  Good   ECT-MADRS    Flowsheet Row Video Visit from 08/09/2022 in [facility]  MADRS Total Score 45      DIAGNOSES:    ICD-10-CM   1. Treatment-resistant depression  F32.9     2. Generalized anxiety disorder  F41.1     3. Social anxiety disorder  F40.10     4. Former smoker  Z87.891     5. Insomnia, unspecified type  G47.00       Receiving Psychotherapy: No   RECOMMENDATIONS:  PDMP was reviewed. Ativan filled 05/15/2022. I provided 20 minutes of non-face to face time during this encounter, including time spent before and after the visit in records review, medical decision making, counseling pertinent to today's visit, and charting.   Discussed Auvelity. She's taken Wellbutrin and said she was more depressed on it, but looking back, she thinks it's b/c her dad died around that same time. Would like to try Auvelity if it's ok.  Benefits, risks, SE discussed and she accepts.   Start Auvelity 45-105 mg, 1 po bid. Sent to [facility]. Pt lives in [place] (1 hr away) so I didn't ask her to p/u samples. If we have them and she can't get the Auvelity right away, she can come get them. She'll see what happens w/ the Rx first.  Continue Ativan 1 mg, 1 po qhs. Continue Zoloft 100 mg 2 pills daily.. Return in 6 wks,  Donnal Moat, PA-C

## 2022-10-15 ENCOUNTER — Telehealth: Payer: Self-pay | Admitting: Physician Assistant

## 2022-10-15 NOTE — Telephone Encounter (Signed)
Pulled 1 bottle of samples for patient. Told her the coupon system has been down since Thursday, not sure when it will be restored. She will come get samples.

## 2022-10-15 NOTE — Telephone Encounter (Signed)
I returned call from pt that she left at 1:08p.  She also added that she has 4 days of meds left.

## 2022-10-15 NOTE — Telephone Encounter (Signed)
Patient called in stating that  she was prescribed Auvelity '45mg'$ . There is no longer coupon for medication and costs $1000. She really likes the medicine and doesn't want to be taken off.  Is there anyway she can get some help with this. Ph: [phone]

## 2022-11-04 ENCOUNTER — Ambulatory Visit (INDEPENDENT_AMBULATORY_CARE_PROVIDER_SITE_OTHER): Payer: No Typology Code available for payment source | Admitting: Family Medicine

## 2022-11-04 ENCOUNTER — Telehealth: Payer: Self-pay

## 2022-11-04 ENCOUNTER — Encounter: Payer: Self-pay | Admitting: Family Medicine

## 2022-11-04 VITALS — BP 102/72 | HR 109 | Ht 68.0 in | Wt 177.0 lb

## 2022-11-04 DIAGNOSIS — E785 Hyperlipidemia, unspecified: Secondary | ICD-10-CM

## 2022-11-04 DIAGNOSIS — R7301 Impaired fasting glucose: Secondary | ICD-10-CM

## 2022-11-04 DIAGNOSIS — E039 Hypothyroidism, unspecified: Secondary | ICD-10-CM | POA: Diagnosis not present

## 2022-11-04 DIAGNOSIS — Z1159 Encounter for screening for other viral diseases: Secondary | ICD-10-CM

## 2022-11-04 DIAGNOSIS — M79643 Pain in unspecified hand: Secondary | ICD-10-CM | POA: Insufficient documentation

## 2022-11-04 DIAGNOSIS — Z1211 Encounter for screening for malignant neoplasm of colon: Secondary | ICD-10-CM

## 2022-11-04 DIAGNOSIS — I10 Essential (primary) hypertension: Secondary | ICD-10-CM

## 2022-11-04 DIAGNOSIS — Z114 Encounter for screening for human immunodeficiency virus [HIV]: Secondary | ICD-10-CM

## 2022-11-04 DIAGNOSIS — M79642 Pain in left hand: Secondary | ICD-10-CM

## 2022-11-04 NOTE — Progress Notes (Signed)
New Patient Office Visit   Subjective   Patient ID: LENDSEY WEDEKING, female    DOB: [birth year]  Age: 47 y.o. 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 She  has a past medical history of Agoraphobia, Allergy, and Depression.  Left Hand Discomfort  There was no injury mechanism. Pins and needles sensation is present in the left hand started a few months ago.Patient reports sensation does not radiate. The patient is experiencing no pain. Discomfort has been constant since the incident. Associated symptoms include tingling. Pertinent negatives include no chest pain, muscle weakness or numbness. The symptoms are aggravated by movement. 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 She has not tried anything for the symptoms.      Outpatient Encounter Medications as of 11/04/2022  Medication Sig   Dextromethorphan-buPROPion ER (AUVELITY) 45-105 MG TBCR Take 1 tablet by mouth 2 (two) times daily.   esomeprazole (NEXIUM) 40 MG capsule Take 40 mg by mouth daily at 12 noon.   LORazepam (ATIVAN) 1 MG tablet Take 1 tablet (1 mg total) by mouth at bedtime.   nicotine polacrilex (NICORETTE) 4 MG gum Take 4 mg by mouth as needed for smoking cessation.   sertraline (ZOLOFT) 100 MG tablet Take 2 tablets (200 mg total) by mouth daily.   Ascorbic Acid (VITAMIN C) 100 MG tablet Take 100 mg by mouth daily. (Patient not taking: Reported on 11/04/2022)   b complex vitamins capsule Take 1 capsule by mouth daily. (Patient not taking: Reported on 11/04/2022)   cholecalciferol (VITAMIN D3) 25 MCG (1000 UNIT) tablet Take 1,000 Units by mouth daily. (Patient not taking: Reported on 11/04/2022)   Cyanocobalamin (B-12 COMPLIANCE INJECTION IJ) Inject as directed. (Patient not taking: Reported on 11/04/2022)   Multiple Vitamin (MULTIVITAMIN) capsule Take 1 capsule by  mouth daily. (Patient not taking: Reported on 11/04/2022)   zinc gluconate 50 MG tablet Take 50 mg by mouth daily. (Patient not taking: Reported on 11/04/2022)   No facility-administered encounter medications on file as of 11/04/2022.    Past Surgical History:  Procedure Laterality Date   ABDOMINAL HYSTERECTOMY      Review of Systems  Constitutional:  Negative for chills and fever.  Eyes:  Negative for blurred vision.  Respiratory:  Negative for shortness of breath.   Gastrointestinal:  Negative for abdominal pain.  Genitourinary:  Negative for dysuria.  Musculoskeletal:  Negative for myalgias.      Objective    BP 102/72   Pulse (!) 109   Ht 5\' 8"  (1.727 m)   Wt 177 lb (80.3 kg)   SpO2 98%   BMI 26.91 kg/m   Physical Exam HENT:     Head: Normocephalic.     Right Ear: Tympanic membrane normal.     Left Ear: Tympanic membrane normal.  Cardiovascular:     Rate and Rhythm: Normal rate and regular rhythm.     Pulses: Normal pulses.     Heart sounds: Normal heart sounds.  Pulmonary:     Effort: Pulmonary effort is normal.     Breath sounds: Normal breath sounds.  Abdominal:     General: Bowel sounds are normal.     Palpations: Abdomen is soft.  Musculoskeletal:        General: No swelling or tenderness. Normal range of motion.  Right wrist: Normal. No swelling or tenderness. Normal range of motion.     Left wrist: Normal. No swelling or tenderness. Normal range of motion.     Right hand: Normal. No swelling. Normal range of motion. Normal strength.     Left hand: Normal. No swelling. Normal range of motion. Normal strength.     Cervical back: Normal range of motion.  Skin:    General: Skin is warm and dry.     Capillary Refill: Capillary refill takes less than 2 seconds.  Neurological:     General: No focal deficit present.     Mental Status: She is alert.  Psychiatric:        Mood and Affect: Mood normal.       Assessment & Plan:  IFG (impaired fasting  glucose) -     Hemoglobin A1c  Primary hypertension -     CBC with Differential/Platelet -     CMP14+EGFR -     Microalbumin / creatinine urine ratio  Hyperlipidemia, unspecified hyperlipidemia type -     Lipid panel  Hypothyroidism, unspecified type -     TSH + free T4  Screening for HIV (human immunodeficiency virus) -     HIV Antibody (routine testing w rflx)  Need for hepatitis C screening test -     Hepatitis C antibody  Screening for colon cancer -     Ambulatory referral to Gastroenterology  Pain of left hand Assessment & Plan: Discussed non pharmacological interventions include rest, wearing a splint at night. Alternate use of  ice and heat. Follow up for worsening or persistent symptoms.The use of NSAIDs for pain management.  Follow up for worsening or persistent symptoms. Patient verbalizes understanding regarding plan of care and all questions answered.      Return in about 1 week (around 11/11/2022) for treatment options.   Renard Hamper Ria Comment, FNP

## 2022-11-04 NOTE — Patient Instructions (Addendum)
It was pleasure meeting with you today. Follow up with your primary health provider if any health concerns arises.  

## 2022-11-04 NOTE — Assessment & Plan Note (Signed)
Discussed non pharmacological interventions include rest, wearing a splint at night. Alternate use of  ice and heat. Follow up for worsening or persistent symptoms.The use of NSAIDs for pain management.  Follow up for worsening or persistent symptoms. Patient verbalizes understanding regarding plan of care and all questions answered.

## 2022-11-04 NOTE — Telephone Encounter (Signed)
Prior Authorization submitted for Auvelity 45-105 mg #60/30 day, with [employer] Pending response

## 2022-11-05 ENCOUNTER — Encounter: Payer: Self-pay | Admitting: *Deleted

## 2022-11-06 ENCOUNTER — Other Ambulatory Visit: Payer: Self-pay | Admitting: Family Medicine

## 2022-11-06 LAB — CBC WITH DIFFERENTIAL/PLATELET
Basophils Absolute: 0.1 10*3/uL (ref 0.0–0.2)
Basos: 1 %
EOS (ABSOLUTE): 0.2 10*3/uL (ref 0.0–0.4)
Eos: 2 %
Hematocrit: 45.1 % (ref 34.0–46.6)
Hemoglobin: 15.4 g/dL (ref 11.1–15.9)
Immature Grans (Abs): 0 10*3/uL (ref 0.0–0.1)
Immature Granulocytes: 0 %
Lymphocytes Absolute: 3.5 10*3/uL — ABNORMAL HIGH (ref 0.7–3.1)
Lymphs: 26 %
MCH: 29.7 pg (ref 26.6–33.0)
MCHC: 34.1 g/dL (ref 31.5–35.7)
MCV: 87 fL (ref 79–97)
Monocytes Absolute: 1 10*3/uL — ABNORMAL HIGH (ref 0.1–0.9)
Monocytes: 8 %
Neutrophils Absolute: 8.9 10*3/uL — ABNORMAL HIGH (ref 1.4–7.0)
Neutrophils: 63 %
Platelets: 296 10*3/uL (ref 150–450)
RBC: 5.18 x10E6/uL (ref 3.77–5.28)
RDW: 12.9 % (ref 11.7–15.4)
WBC: 13.8 10*3/uL — ABNORMAL HIGH (ref 3.4–10.8)

## 2022-11-06 LAB — CMP14+EGFR
ALT: 22 IU/L (ref 0–32)
AST: 21 IU/L (ref 0–40)
Albumin/Globulin Ratio: 1.8 (ref 1.2–2.2)
Albumin: 4.5 g/dL (ref 3.9–4.9)
Alkaline Phosphatase: 88 IU/L (ref 44–121)
BUN/Creatinine Ratio: 20 (ref 9–23)
BUN: 18 mg/dL (ref 6–24)
Bilirubin Total: 0.2 mg/dL (ref 0.0–1.2)
CO2: 23 mmol/L (ref 20–29)
Calcium: 10.2 mg/dL (ref 8.7–10.2)
Chloride: 103 mmol/L (ref 96–106)
Creatinine, Ser: 0.91 mg/dL (ref 0.57–1.00)
Globulin, Total: 2.5 g/dL (ref 1.5–4.5)
Glucose: 84 mg/dL (ref 70–99)
Potassium: 4.4 mmol/L (ref 3.5–5.2)
Sodium: 142 mmol/L (ref 134–144)
Total Protein: 7 g/dL (ref 6.0–8.5)
eGFR: 78 mL/min/{1.73_m2} (ref >59)

## 2022-11-06 LAB — MICROALBUMIN / CREATININE URINE RATIO
Creatinine, Urine: 86.9 mg/dL
Microalb/Creat Ratio: 6 mg/g creat (ref 0–29)
Microalbumin, Urine: 5.4 ug/mL

## 2022-11-06 LAB — HEMOGLOBIN A1C
Est. average glucose Bld gHb Est-mCnc: 105 mg/dL
Hgb A1c MFr Bld: 5.3 % (ref 4.8–5.6)

## 2022-11-06 LAB — HIV ANTIBODY (ROUTINE TESTING W REFLEX): HIV Screen 4th Generation wRfx: NONREACTIVE

## 2022-11-06 LAB — TSH+FREE T4
Free T4: 1.13 ng/dL (ref 0.82–1.77)
TSH: 1.43 u[IU]/mL (ref 0.450–4.500)

## 2022-11-06 LAB — LIPID PANEL
Chol/HDL Ratio: 4.3 ratio (ref 0.0–4.4)
Cholesterol, Total: 273 mg/dL — ABNORMAL HIGH (ref 100–199)
HDL: 64 mg/dL (ref >39)
LDL Chol Calc (NIH): 178 mg/dL — ABNORMAL HIGH (ref 0–99)
Triglycerides: 170 mg/dL — ABNORMAL HIGH (ref 0–149)
VLDL Cholesterol Cal: 31 mg/dL (ref 5–40)

## 2022-11-06 LAB — HEPATITIS C ANTIBODY: Hep C Virus Ab: NONREACTIVE

## 2022-11-06 MED ORDER — ROSUVASTATIN CALCIUM 5 MG PO TABS: 5.0000 mg | ORAL_TABLET | Freq: Every day | ORAL | 3 refills | Status: DC

## 2022-11-11 NOTE — Telephone Encounter (Signed)
[facility] [employer] approved AUVELITY 45-105mg  tab ( 11/11/22-11/11/2023)

## 2022-11-12 ENCOUNTER — Encounter: Payer: Self-pay | Admitting: Physician Assistant

## 2022-11-12 ENCOUNTER — Telehealth: Payer: Self-pay

## 2022-11-12 ENCOUNTER — Telehealth (INDEPENDENT_AMBULATORY_CARE_PROVIDER_SITE_OTHER): Payer: No Typology Code available for payment source | Admitting: Physician Assistant

## 2022-11-12 DIAGNOSIS — F329 Major depressive disorder, single episode, unspecified: Secondary | ICD-10-CM | POA: Diagnosis not present

## 2022-11-12 DIAGNOSIS — F4010 Social phobia, unspecified: Secondary | ICD-10-CM

## 2022-11-12 DIAGNOSIS — F17200 Nicotine dependence, unspecified, uncomplicated: Secondary | ICD-10-CM | POA: Diagnosis not present

## 2022-11-12 DIAGNOSIS — F411 Generalized anxiety disorder: Secondary | ICD-10-CM | POA: Diagnosis not present

## 2022-11-12 MED ORDER — AUVELITY 45-105 MG PO TBCR: 1.0000 | EXTENDED_RELEASE_TABLET | Freq: Two times a day (BID) | ORAL | 1 refills | Status: DC

## 2022-11-12 NOTE — Telephone Encounter (Signed)
noted 

## 2022-11-12 NOTE — Telephone Encounter (Signed)
Pt's initial PA submitted for Auvelity was denied but I resubmitted with additional information and she has now received an approval with [facility] [employer] effective 11/11/2022-11/11/2023

## 2022-11-12 NOTE — Telephone Encounter (Signed)
Pt made aware during her OV this morning. She REALLY appreciates your help Traci!

## 2022-11-12 NOTE — Progress Notes (Signed)
[facility]  Patient ID: Victoria Mendez,  MRN: [id number]  PCP: Juanda Chance, FNP  Date of Evaluation: 11/12/2022 Time spent:20 minutes  Chief Complaint:  Chief Complaint   Depression; Anxiety; Follow-up   Virtual Visit via Telehealth  I connected with patient by a video enabled telemedicine application with their informed consent, and verified patient privacy and that I am speaking with the correct person using two identifiers.  I am private, in my office and the patient is at home.  I discussed the limitations, risks, security and privacy concerns of performing an evaluation and management service by video and the availability of in person appointments. I also discussed with the patient that there may be a patient responsible charge related to this service. The patient expressed understanding and agreed to proceed.   I discussed the assessment and treatment plan with the patient. The patient was provided an opportunity to ask questions and all were answered. The patient agreed with the plan and demonstrated an understanding of the instructions.   The patient was advised to call back or seek an in-person evaluation if the symptoms worsen or if the condition fails to improve as anticipated.  I provided 20 minutes of non-face-to-face time during this encounter.  HISTORY/CURRENT STATUS: HPI for routine med check.  Has been on Auvelity for about 2 months now. "It's a miracle drug." Is now working without looking at at the clock. Her husband even has to get her off the computer (she works from home) some days.  States she has not been like that ever in her life.  She still does not want to do things like go out in do errands or do anything fun like that but just being out of bed and focusing on work when she was afraid she would lose her job a few months ago is a huge improvement.  She no longer feels guilty about saying no to people if she does not want to do  something.  She is sleeping well and feels rested when she gets up.  Personal hygiene is normal.  She has decreased smoking down to 3 or 4 cigarettes/day.  Appetite is normal and weight is stable.  Still has anxiety almost daily, not having panic attacks but still gets overwhelmed at times.  Ativan helps.  Denies suicidal or homicidal thoughts.  Patient denies increased energy with decreased need for sleep, increased talkativeness, racing thoughts, impulsivity or risky behaviors, increased spending, increased libido, grandiosity, increased irritability or anger, paranoia, or hallucinations.  Denies dizziness, syncope, seizures, numbness, tingling, tremor, tics, unsteady gait, slurred speech, confusion. Denies muscle or joint pain, stiffness, or dystonia.  Individual Medical History/ Review of Systems: Changes? :No   Past medications for mental health diagnoses include: Prozac, Pristiq, Ativan, Buspar, Wellbutrin (over 150 mg caused her to cry more)  Hydroxyzine, Zoloft  Allergies: Penicillins  Current Medications:  Current Outpatient Medications:    esomeprazole (NEXIUM) 40 MG capsule, Take 40 mg by mouth daily at 12 noon., Disp: , Rfl:    LORazepam (ATIVAN) 1 MG tablet, Take 1 tablet (1 mg total) by mouth at bedtime., Disp: 90 tablet, Rfl: 1   nicotine polacrilex (NICORETTE) 4 MG gum, Take 4 mg by mouth as needed for smoking cessation., Disp: , Rfl:    rosuvastatin (CRESTOR) 5 MG tablet, Take 1 tablet (5 mg total) by mouth daily., Disp: 90 tablet, Rfl: 3   sertraline (ZOLOFT) 100 MG tablet, Take 2 tablets (200 mg total) by  mouth daily., Disp: 180 tablet, Rfl: 3   Ascorbic Acid (VITAMIN C) 100 MG tablet, Take 100 mg by mouth daily. (Patient not taking: Reported on 11/04/2022), Disp: , Rfl:    b complex vitamins capsule, Take 1 capsule by mouth daily. (Patient not taking: Reported on 11/04/2022), Disp: 30 capsule, Rfl:    cholecalciferol (VITAMIN D3) 25 MCG (1000 UNIT) tablet, Take 1,000 Units by  mouth daily. (Patient not taking: Reported on 11/04/2022), Disp: , Rfl:    Cyanocobalamin (B-12 COMPLIANCE INJECTION IJ), Inject as directed. (Patient not taking: Reported on 11/04/2022), Disp: , Rfl:    Dextromethorphan-buPROPion ER (AUVELITY) 45-105 MG TBCR, Take 1 tablet by mouth 2 (two) times daily., Disp: 180 tablet, Rfl: 1   Multiple Vitamin (MULTIVITAMIN) capsule, Take 1 capsule by mouth daily. (Patient not taking: Reported on 11/04/2022), Disp: , Rfl:    zinc gluconate 50 MG tablet, Take 50 mg by mouth daily. (Patient not taking: Reported on 11/04/2022), Disp: , Rfl:  Medication Side Effects: none  Family Medical/ Social History: Changes? no  MENTAL HEALTH EXAM:  There were no vitals taken for this visit.There is no height or weight on file to calculate BMI.  General Appearance: Casual and Well Groomed  Eye Contact:  Good  Speech:  Clear and Coherent and Normal Rate  Volume:  Normal  Mood:  Euthymic  Affect:  Congruent  Thought Process:  Goal Directed and Descriptions of Associations: Circumstantial  Orientation:  Full (Time, Place, and Person)  Thought Content: Logical   Suicidal Thoughts:  No  Homicidal Thoughts:  No  Memory:  WNL  Judgement:  Good  Insight:  Good  Psychomotor Activity:  Normal  Concentration:  Concentration: Good and Attention Span: Good  Recall:  Good  Fund of Knowledge: Good  Language: Good  Assets:  Desire for Improvement  ADL's:  Intact  Cognition: WNL  Prognosis:  Good   ECT-MADRS    Flowsheet Row Video Visit from 08/09/2022 in [facility] Total Score 45      [place] Office Visit from 11/04/2022 in [facility]  Total GAD-7 Score 8      PHQ2-9    [place] Office Visit from 11/04/2022 in [facility]  PHQ-2 Total Score 0  PHQ-9 Total Score 0      DIAGNOSES:    ICD-10-CM   1. Treatment-resistant depression  F32.9     2.  Generalized anxiety disorder  F41.1     3. Social anxiety disorder  F40.10     4. Smoker  F17.200       Receiving Psychotherapy: No   RECOMMENDATIONS:  PDMP was reviewed. Ativan filled 09/17/2022. I provided 20 minutes of non-face-to-face time during this encounter, including time spent before and after the visit in records review, medical decision making, counseling pertinent to today's visit, and charting.   I am glad to see her doing so much better!  We agree to make no changes at this time. Congratulations on decreasing the amount of cigarettes per day.  Continue smoking cessation attempt.  Continue Auvelity 45-105 mg, 1 p.o. twice daily. Continue Ativan 1 mg, 1 po qhs. Continue nicotine gum or Mantz as needed. Continue Zoloft 100 mg 2 pills daily.. Return in 3 months.  Donnal Moat, PA-C

## 2022-11-14 ENCOUNTER — Encounter: Payer: Self-pay | Admitting: Family Medicine

## 2022-11-14 ENCOUNTER — Ambulatory Visit: Payer: No Typology Code available for payment source | Admitting: Family Medicine

## 2022-11-14 VITALS — BP 127/85 | HR 94 | Ht 68.0 in | Wt 180.0 lb

## 2022-11-14 DIAGNOSIS — S139XXA Sprain of joints and ligaments of unspecified parts of neck, initial encounter: Secondary | ICD-10-CM

## 2022-11-14 DIAGNOSIS — S139XXD Sprain of joints and ligaments of unspecified parts of neck, subsequent encounter: Secondary | ICD-10-CM

## 2022-11-14 MED ORDER — METHOCARBAMOL 500 MG PO TABS: 500.0000 mg | ORAL_TABLET | Freq: Three times a day (TID) | ORAL | 1 refills | Status: AC | PRN

## 2022-11-14 NOTE — Progress Notes (Signed)
New Patient Office Visit   Subjective   Patient ID: Victoria Mendez, female    DOB: [birth year]  Age: 47 y.o. MRN: [id number]  CC:  Chief Complaint  Patient presents with   Hand Pain    Patient is here for L hand pain f/u from a week ago. States it feels the same as last visit. Also has burning on the L side of her neck.     HPI Victoria Mendez 47 year old female, presents to the clinic for neck pain. She  has a past medical history of Agoraphobia, Allergy, and Depression.  Neck Pain  The current episode started last week. Patient reports pain occurs constantly and has been gradually worsening. The pain is present in the left side. The quality of the pain is described as burning, aching. The pain is at a severity of 9/10. The symptoms are aggravated by position, bending and twisting. The pain is worse during the day. Stiffness is present all day. Associated symptoms include numbness and tingling. Pertinent negatives include no chest pain, fever, headaches, pain with swallowing, trouble swallowing, visual change or weakness. She has tried acetaminophen and NSAIDs for the symptoms. The treatment provided no relief.      Outpatient Encounter Medications as of 11/14/2022  Medication Sig   Ascorbic Acid (VITAMIN C) 100 MG tablet Take 100 mg by mouth daily.   b complex vitamins capsule Take 1 capsule by mouth daily.   cholecalciferol (VITAMIN D3) 25 MCG (1000 UNIT) tablet Take 1,000 Units by mouth daily.   Cyanocobalamin (B-12 COMPLIANCE INJECTION IJ) Inject as directed.   Dextromethorphan-buPROPion ER (AUVELITY) 45-105 MG TBCR Take 1 tablet by mouth 2 (two) times daily.   esomeprazole (NEXIUM) 40 MG capsule Take 40 mg by mouth daily at 12 noon.   estradiol (VIVELLE-DOT) 0.075 MG/24HR Place onto the skin.   LORazepam (ATIVAN) 1 MG tablet Take 1 tablet (1 mg total) by mouth at bedtime.   methocarbamol (ROBAXIN) 500 MG tablet Take 1 tablet (500 mg total) by mouth 3 (three) times daily as  needed for muscle spasms.   Multiple Vitamin (MULTIVITAMIN) capsule Take 1 capsule by mouth daily.   nicotine polacrilex (NICORETTE) 4 MG gum Take 4 mg by mouth as needed for smoking cessation.   rosuvastatin (CRESTOR) 5 MG tablet Take 1 tablet (5 mg total) by mouth daily.   sertraline (ZOLOFT) 100 MG tablet Take 2 tablets (200 mg total) by mouth daily.   zinc gluconate 50 MG tablet Take 50 mg by mouth daily.   No facility-administered encounter medications on file as of 11/14/2022.    Past Surgical History:  Procedure Laterality Date   ABDOMINAL HYSTERECTOMY      Review of Systems  Constitutional:  Negative for chills and fever.  Eyes:  Negative for blurred vision and double vision.  Respiratory:  Negative for shortness of breath.   Cardiovascular:  Negative for chest pain.  Gastrointestinal:  Negative for nausea and vomiting.  Genitourinary:  Negative for dysuria and urgency.  Musculoskeletal:  Positive for myalgias and neck pain.  Neurological:  Negative for dizziness, tingling and headaches.      Objective    BP 127/85   Pulse 94   Ht 5\' 8"  (1.727 m)   Wt 180 lb (81.6 kg)   SpO2 94%   BMI 27.37 kg/m   Physical Exam HENT:     Head: Normocephalic.  Neck:     Trachea: Trachea normal.  Cardiovascular:  Pulses: Normal pulses.  Pulmonary:     Effort: Pulmonary effort is normal.     Breath sounds: Normal breath sounds.  Musculoskeletal:        General: Normal range of motion.     Cervical back: Normal range of motion. No erythema, rigidity or tenderness. No pain with movement. Normal range of motion.  Lymphadenopathy:     Cervical: No cervical adenopathy.  Skin:    General: Skin is warm.     Capillary Refill: Capillary refill takes less than 2 seconds.  Neurological:     General: No focal deficit present.     Mental Status: She is alert.     Coordination: Coordination normal.     Gait: Gait normal.       Assessment & Plan:  Cervical sprain, initial  encounter Assessment & Plan: Robaxin 500 mg PRN Explained to patient non pharmacological interventions include the use of ice or heat, rest, recommend range of motion exercises, gentle stretching. Follow up for worsening or persistent symptoms. Patient verbalizes understanding regarding plan of care and all questions answered.   Orders: -     Methocarbamol; Take 1 tablet (500 mg total) by mouth 3 (three) times daily as needed for muscle spasms.  Dispense: 30 tablet; Refill: 1    No follow-ups on file.   Renard Hamper Ria Comment, FNP

## 2022-11-14 NOTE — Assessment & Plan Note (Signed)
Robaxin 500 mg PRN Explained to patient non pharmacological interventions include the use of ice or heat, rest, recommend range of motion exercises, gentle stretching. Follow up for worsening or persistent symptoms. Patient verbalizes understanding regarding plan of care and all questions answered.

## 2022-11-14 NOTE — Patient Instructions (Signed)
It was pleasure meeting with you today. Please take medications as prescribed. Follow up with your primary health provider if any health concerns arises. If symptoms worsen please contact your primary care provider and/or visit the emergency department.  

## 2022-12-06 ENCOUNTER — Ambulatory Visit: Payer: No Typology Code available for payment source | Admitting: Family Medicine

## 2022-12-17 ENCOUNTER — Encounter: Payer: Self-pay | Admitting: Family Medicine

## 2022-12-20 ENCOUNTER — Ambulatory Visit: Payer: No Typology Code available for payment source | Admitting: Family Medicine

## 2023-01-01 ENCOUNTER — Ambulatory Visit: Payer: No Typology Code available for payment source | Admitting: Family Medicine

## 2023-01-01 ENCOUNTER — Encounter: Payer: Self-pay | Admitting: Family Medicine

## 2023-01-01 VITALS — BP 106/75 | HR 104 | Resp 16 | Ht 68.0 in | Wt 180.0 lb

## 2023-01-01 DIAGNOSIS — R12 Heartburn: Secondary | ICD-10-CM | POA: Insufficient documentation

## 2023-01-01 MED ORDER — GABAPENTIN 300 MG PO CAPS: 300.0000 mg | ORAL_CAPSULE | Freq: Three times a day (TID) | ORAL | 3 refills | Status: DC | PRN

## 2023-01-01 MED ORDER — PANTOPRAZOLE SODIUM 40 MG PO TBEC: 40.0000 mg | DELAYED_RELEASE_TABLET | Freq: Every day | ORAL | 2 refills | Status: DC

## 2023-01-01 NOTE — Progress Notes (Signed)
Patient Office Visit   Subjective   Patient ID: Victoria Mendez, female    DOB: [birth year]  Age: 47 y.o. MRN: [id number]  CC:  Chief Complaint  Patient presents with   Gastroesophageal Reflux    Has been walking up at night choking due to reflux. Has been taking nexium for past 10 years and doesn't think its working for her anymore    HPI Victoria Mendez 47 year old female, presents to establish care. She  has a past medical history of Agoraphobia, Allergy, and Depression.  Heartburn She complains of choking, coughing, heartburn and a hoarse voice. She reports no abdominal pain, no chest pain or no nausea. This is a recurrent problem. The problem occurs occasionally. The problem has been gradually worsening. The heartburn duration is an hour. The heartburn is located in the substernum. The heartburn wakes her from sleep. The heartburn does not limit her activity. The heartburn doesn't change with position. The symptoms are aggravated by certain foods and smoking. Associated symptoms include fatigue. Pertinent negatives include no melena. Risk factors include smoking/tobacco exposure, caffeine use and NSAIDs. She has tried an antacid for the symptoms. The treatment provided no relief.      Outpatient Encounter Medications as of 01/01/2023  Medication Sig   Ascorbic Acid (VITAMIN C) 100 MG tablet Take 100 mg by mouth daily.   b complex vitamins capsule Take 1 capsule by mouth daily.   cholecalciferol (VITAMIN D3) 25 MCG (1000 UNIT) tablet Take 1,000 Units by mouth daily.   Cyanocobalamin (B-12 COMPLIANCE INJECTION IJ) Inject as directed.   Dextromethorphan-buPROPion ER (AUVELITY) 45-105 MG TBCR Take 1 tablet by mouth 2 (two) times daily.   esomeprazole (NEXIUM) 40 MG capsule Take 40 mg by mouth daily at 12 noon.   estradiol (VIVELLE-DOT) 0.075 MG/24HR Place onto the skin.   gabapentin (NEURONTIN) 300 MG capsule Take 1 capsule (300 mg total) by mouth 3 (three) times daily as needed.    LORazepam (ATIVAN) 1 MG tablet Take 1 tablet (1 mg total) by mouth at bedtime.   methocarbamol (ROBAXIN) 500 MG tablet Take 1 tablet (500 mg total) by mouth 3 (three) times daily as needed for muscle spasms.   Multiple Vitamin (MULTIVITAMIN) capsule Take 1 capsule by mouth daily.   nicotine polacrilex (NICORETTE) 4 MG gum Take 4 mg by mouth as needed for smoking cessation.   pantoprazole (PROTONIX) 40 MG tablet Take 1 tablet (40 mg total) by mouth daily.   rosuvastatin (CRESTOR) 5 MG tablet Take 1 tablet (5 mg total) by mouth daily.   sertraline (ZOLOFT) 100 MG tablet Take 2 tablets (200 mg total) by mouth daily.   zinc gluconate 50 MG tablet Take 50 mg by mouth daily.   No facility-administered encounter medications on file as of 01/01/2023.    Past Surgical History:  Procedure Laterality Date   ABDOMINAL HYSTERECTOMY      Review of Systems  Constitutional:  Positive for fatigue. Negative for chills and fever.  HENT:  Positive for hoarse voice.   Eyes:  Negative for blurred vision and double vision.  Respiratory:  Positive for cough and choking.   Cardiovascular:  Negative for chest pain and palpitations.  Gastrointestinal:  Positive for heartburn. Negative for abdominal pain, blood in stool, constipation, diarrhea, melena, nausea and vomiting.  Genitourinary:  Negative for dysuria.  Skin:  Negative for itching and rash.      Objective    BP 106/75   Pulse (!) 104  Resp 16   Ht 5\' 8"  (1.727 m)   Wt 180 lb (81.6 kg)   SpO2 96%   BMI 27.37 kg/m   Physical Exam Vitals reviewed.  Constitutional:      General: She is not in acute distress.    Appearance: Normal appearance. She is not ill-appearing, toxic-appearing or diaphoretic.  HENT:     Head: Normocephalic.  Eyes:     General:        Right eye: No discharge.        Left eye: No discharge.     Conjunctiva/sclera: Conjunctivae normal.  Cardiovascular:     Rate and Rhythm: Normal rate.     Pulses: Normal pulses.      Heart sounds: Normal heart sounds.  Pulmonary:     Effort: Pulmonary effort is normal. No respiratory distress.     Breath sounds: Normal breath sounds.  Abdominal:     General: Bowel sounds are normal.     Palpations: Abdomen is soft.     Tenderness: There is no abdominal tenderness. There is no right CVA tenderness.  Musculoskeletal:        General: Normal range of motion.     Cervical back: Normal range of motion.  Skin:    General: Skin is warm and dry.     Capillary Refill: Capillary refill takes less than 2 seconds.  Neurological:     General: No focal deficit present.     Mental Status: She is alert and oriented to person, place, and time.     Coordination: Coordination normal.     Gait: Gait normal.  Psychiatric:        Mood and Affect: Mood normal.        Behavior: Behavior normal.        Thought Content: Thought content normal.        Judgment: Judgment normal.       Assessment & Plan:  Heart burn -     Pantoprazole Sodium; Take 1 tablet (40 mg total) by mouth daily.  Dispense: 30 tablet; Refill: 2  Heartburn Assessment & Plan: Started Protonix 40 mg Explained to patient lifestyle modifications, weight loss, smoking cessation, loose clothing, and avoiding caffeine and trigger foods. Follow up in 4 weeks, if symptoms persist possible referral to EGD    Other orders -     Gabapentin; Take 1 capsule (300 mg total) by mouth 3 (three) times daily as needed.  Dispense: 90 capsule; Refill: 3    Return in about 4 weeks (around 01/29/2023) for protonix medication and gabapetin .   [name], FNP

## 2023-01-01 NOTE — Assessment & Plan Note (Signed)
Started Protonix 40 mg Explained to patient lifestyle modifications, weight loss, smoking cessation, loose clothing, and avoiding caffeine and trigger foods. Follow up in 4 weeks, if symptoms persist possible referral to EGD   

## 2023-01-01 NOTE — Patient Instructions (Signed)
        Great to see you today.   - Please take medications as prescribed. - Follow up with your primary health provider if any health concerns arises. - If symptoms worsen please contact your primary care provider and/or visit the emergency department.  

## 2023-01-29 ENCOUNTER — Encounter: Payer: Self-pay | Admitting: Family Medicine

## 2023-01-29 ENCOUNTER — Telehealth: Payer: No Typology Code available for payment source | Admitting: Physician Assistant

## 2023-01-29 ENCOUNTER — Encounter: Payer: Self-pay | Admitting: Physician Assistant

## 2023-01-29 ENCOUNTER — Telehealth: Payer: No Typology Code available for payment source | Admitting: Family Medicine

## 2023-01-29 VITALS — Ht 67.0 in | Wt 180.0 lb

## 2023-01-29 DIAGNOSIS — F329 Major depressive disorder, single episode, unspecified: Secondary | ICD-10-CM | POA: Diagnosis not present

## 2023-01-29 DIAGNOSIS — R12 Heartburn: Secondary | ICD-10-CM | POA: Diagnosis not present

## 2023-01-29 DIAGNOSIS — F4010 Social phobia, unspecified: Secondary | ICD-10-CM

## 2023-01-29 DIAGNOSIS — F411 Generalized anxiety disorder: Secondary | ICD-10-CM | POA: Diagnosis not present

## 2023-01-29 DIAGNOSIS — F17200 Nicotine dependence, unspecified, uncomplicated: Secondary | ICD-10-CM

## 2023-01-29 NOTE — Progress Notes (Signed)
Virtual Visit via Video Note  I connected with Victoria Mendez on 01/29/23 at  4:20 PM EDT by a video enabled telemedicine application and verified that I am speaking with the correct person using two identifiers.  Patient Location: Home Provider Location: Office/Clinic  I discussed the limitations, risks, security, and privacy concerns of performing an evaluation and management service by video and the availability of in person appointments. I also discussed with the patient that there may be a patient responsible charge related to this service. The patient expressed understanding and agreed to proceed.  Subjective: PCP: [name], FNP  Chief Complaint  Patient presents with   Follow-up    Patient is being seen for follow up. No changes or concerns since last visit.    HPI  Victoria Mendez 47 year old female, presents via video visit for follow up heartburn medication. For the details of today's visit, please refer to assessment and plan.  ROS: Per HPI  Current Outpatient Medications:    Ascorbic Acid (VITAMIN C) 100 MG tablet, Take 100 mg by mouth daily., Disp: , Rfl:    Dextromethorphan-buPROPion ER (AUVELITY) 45-105 MG TBCR, Take 1 tablet by mouth 2 (two) times daily., Disp: 180 tablet, Rfl: 1   LORazepam (ATIVAN) 1 MG tablet, Take 1 tablet (1 mg total) by mouth at bedtime., Disp: 90 tablet, Rfl: 1   pantoprazole (PROTONIX) 40 MG tablet, Take 1 tablet (40 mg total) by mouth daily., Disp: 30 tablet, Rfl: 2   rosuvastatin (CRESTOR) 5 MG tablet, Take 1 tablet (5 mg total) by mouth daily., Disp: 90 tablet, Rfl: 3   sertraline (ZOLOFT) 100 MG tablet, Take 2 tablets (200 mg total) by mouth daily., Disp: 180 tablet, Rfl: 3   b complex vitamins capsule, Take 1 capsule by mouth daily., Disp: 30 capsule, Rfl:    cholecalciferol (VITAMIN D3) 25 MCG (1000 UNIT) tablet, Take 1,000 Units by mouth daily., Disp: , Rfl:    Cyanocobalamin (B-12 COMPLIANCE INJECTION IJ), Inject as  directed., Disp: , Rfl:    esomeprazole (NEXIUM) 40 MG capsule, Take 40 mg by mouth daily at 12 noon. (Patient not taking: Reported on 01/29/2023), Disp: , Rfl:    estradiol (VIVELLE-DOT) 0.075 MG/24HR, Place onto the skin., Disp: , Rfl:    gabapentin (NEURONTIN) 300 MG capsule, Take 1 capsule (300 mg total) by mouth 3 (three) times daily as needed., Disp: 90 capsule, Rfl: 3   methocarbamol (ROBAXIN) 500 MG tablet, Take 1 tablet (500 mg total) by mouth 3 (three) times daily as needed for muscle spasms., Disp: 30 tablet, Rfl: 1   Multiple Vitamin (MULTIVITAMIN) capsule, Take 1 capsule by mouth daily., Disp: , Rfl:    nicotine polacrilex (NICORETTE) 4 MG gum, Take 4 mg by mouth as needed for smoking cessation., Disp: , Rfl:    zinc gluconate 50 MG tablet, Take 50 mg by mouth daily., Disp: , Rfl:   Observations/Objective: Today's Vitals   01/29/23 1523  Weight: 180 lb (81.6 kg)  Height: 5\' 7"  (1.702 m)   Physical Exam Patient is alert and no acute distress noted.   Assessment and Plan: Heartburn Assessment & Plan: Patient reported taking Protonix 40 mg PRN, helps with her heartburn symptoms. Advise avoid trigger foods that can worsen heartburn, such as spicy or fatty foods, caffeine, citrus fruits, tomatoes, onions, and chocolate. Opt for smaller, more frequent meals rather than large ones. Stay upright after eating: Lying down or bending over can exacerbate heartburn symptoms.  Try to stay upright for at least two to three hours after eating.       Follow Up Instructions: No follow-ups on file.   I discussed the assessment and treatment plan with the patient. The patient was provided an opportunity to ask questions, and all were answered. The patient agreed with the plan and demonstrated an understanding of the instructions.   The patient was advised to call back or seek an in-person evaluation if the symptoms worsen or if the condition fails to improve as anticipated.  The above  assessment and management plan was discussed with the patient. The patient verbalized understanding of and has agreed to the management plan.   [name], FNP

## 2023-01-29 NOTE — Assessment & Plan Note (Addendum)
Patient reported taking Protonix 40 mg PRN, helps with her heartburn symptoms. Advise avoid trigger foods that can worsen heartburn, such as spicy or fatty foods, caffeine, citrus fruits, tomatoes, onions, and chocolate. Opt for smaller, more frequent meals rather than large ones. Stay upright after eating: Lying down or bending over can exacerbate heartburn symptoms. Try to stay upright for at least two to three hours after eating.

## 2023-01-29 NOTE — Progress Notes (Signed)
[facility]  Patient ID: THOMAS SANPEDRO,  MRN: [id number]  PCP: [name], FNP  Date of Evaluation: 01/29/2023 Time spent:30 minutes  Chief Complaint:  Chief Complaint   Depression; Anxiety; Follow-up   Virtual Visit via Telehealth  I connected with patient by a video enabled telemedicine application with their informed consent, and verified patient privacy and that I am speaking with the correct person using two identifiers.  I am private, in my office and the patient is at home.  I discussed the limitations, risks, security and privacy concerns of performing an evaluation and management service by video and the availability of in person appointments. I also discussed with the patient that there may be a patient responsible charge related to this service. The patient expressed understanding and agreed to proceed.   I discussed the assessment and treatment plan with the patient. The patient was provided an opportunity to ask questions and all were answered. The patient agreed with the plan and demonstrated an understanding of the instructions.   The patient was advised to call back or seek an in-person evaluation if the symptoms worsen or if the condition fails to improve as anticipated.  I provided 30 minutes of non-face-to-face time during this encounter.  HISTORY/CURRENT STATUS: HPI for routine med check.  Doing really well. The Auvelity has been working great. She's responded very well.  "I tell everybody about this medication.  It has changed my life."  Work is going well.  She even goes out to eat with her family now where she did not even get out of the house for a couple of years until starting the Auvelity.  She does still dread things at times but after she gets to the event then she is fine.  Energy and motivation are good.  Appetite is normal and weight is stable.  Personal hygiene is normal.  ADLs have improved.  She does have anxiety, more so in  the evenings when her mind will race and if she does not take the Ativan she has trouble relaxing to go to sleep.  She rests well with the Ativan.  No suicidal or homicidal thoughts.  Patient denies increased energy with decreased need for sleep, increased talkativeness, racing thoughts, impulsivity or risky behaviors, increased spending, increased libido, grandiosity, increased irritability or anger, paranoia, or hallucinations.  Denies dizziness, syncope, seizures, numbness, tingling, tremor, tics, unsteady gait, slurred speech, confusion. Denies muscle or joint pain, stiffness, or dystonia.  Individual Medical History/ Review of Systems: Changes? :No   Past medications for mental health diagnoses include: Prozac, Pristiq, Ativan, Buspar, Wellbutrin (over 150 mg caused her to cry more)  Hydroxyzine, Zoloft  Allergies: Penicillins  Current Medications:  Current Outpatient Medications:    Ascorbic Acid (VITAMIN C) 100 MG tablet, Take 100 mg by mouth daily., Disp: , Rfl:    cholecalciferol (VITAMIN D3) 25 MCG (1000 UNIT) tablet, Take 1,000 Units by mouth daily., Disp: , Rfl:    Dextromethorphan-buPROPion ER (AUVELITY) 45-105 MG TBCR, Take 1 tablet by mouth 2 (two) times daily., Disp: 180 tablet, Rfl: 1   LORazepam (ATIVAN) 1 MG tablet, Take 1 tablet (1 mg total) by mouth at bedtime., Disp: 90 tablet, Rfl: 1   pantoprazole (PROTONIX) 40 MG tablet, Take 1 tablet (40 mg total) by mouth daily., Disp: 30 tablet, Rfl: 2   rosuvastatin (CRESTOR) 5 MG tablet, Take 1 tablet (5 mg total) by mouth daily., Disp: 90 tablet, Rfl: 3   sertraline (ZOLOFT) 100  MG tablet, Take 2 tablets (200 mg total) by mouth daily., Disp: 180 tablet, Rfl: 3   b complex vitamins capsule, Take 1 capsule by mouth daily. (Patient not taking: Reported on 01/29/2023), Disp: 30 capsule, Rfl:    Cyanocobalamin (B-12 COMPLIANCE INJECTION IJ), Inject as directed. (Patient not taking: Reported on 01/29/2023), Disp: , Rfl:    esomeprazole  (NEXIUM) 40 MG capsule, Take 40 mg by mouth daily at 12 noon. (Patient not taking: Reported on 01/29/2023), Disp: , Rfl:    estradiol (VIVELLE-DOT) 0.075 MG/24HR, Place onto the skin. (Patient not taking: Reported on 01/29/2023), Disp: , Rfl:    gabapentin (NEURONTIN) 300 MG capsule, Take 1 capsule (300 mg total) by mouth 3 (three) times daily as needed. (Patient not taking: Reported on 01/29/2023), Disp: 90 capsule, Rfl: 3   methocarbamol (ROBAXIN) 500 MG tablet, Take 1 tablet (500 mg total) by mouth 3 (three) times daily as needed for muscle spasms. (Patient not taking: Reported on 01/29/2023), Disp: 30 tablet, Rfl: 1   Multiple Vitamin (MULTIVITAMIN) capsule, Take 1 capsule by mouth daily. (Patient not taking: Reported on 01/29/2023), Disp: , Rfl:    nicotine polacrilex (NICORETTE) 4 MG gum, Take 4 mg by mouth as needed for smoking cessation. (Patient not taking: Reported on 01/29/2023), Disp: , Rfl:    zinc gluconate 50 MG tablet, Take 50 mg by mouth daily. (Patient not taking: Reported on 01/29/2023), Disp: , Rfl:  Medication Side Effects: none  Family Medical/ Social History: Changes? no  MENTAL HEALTH EXAM:  There were no vitals taken for this visit.There is no height or weight on file to calculate BMI.  General Appearance: Casual and Well Groomed  Eye Contact:  Good  Speech:  Clear and Coherent and Normal Rate  Volume:  Normal  Mood:  Euthymic  Affect:  Congruent  Thought Process:  Goal Directed and Descriptions of Associations: Circumstantial  Orientation:  Full (Time, Place, and Person)  Thought Content: Logical   Suicidal Thoughts:  No  Homicidal Thoughts:  No  Memory:  WNL  Judgement:  Good  Insight:  Good  Psychomotor Activity:  Normal  Concentration:  Concentration: Good and Attention Span: Good  Recall:  Good  Fund of Knowledge: Good  Language: Good  Assets:  Desire for Improvement Financial Resources/Insurance Housing Transportation Vocational/Educational  ADL's:   Intact  Cognition: WNL  Prognosis:  Good   ECT-MADRS    Flowsheet Row Video Visit from 08/09/2022 in [facility]  MADRS Total Score 45      GAD-7    Flowsheet Row Office Visit from 01/01/2023 in [facility] Office Visit from 11/14/2022 in [facility] Office Visit from 11/04/2022 in [facility]  Total GAD-7 Score 7 2 8       PHQ2-9    Flowsheet Row Office Visit from 01/01/2023 in [facility] Office Visit from 11/14/2022 in [facility] Office Visit from 11/04/2022 in [facility]  PHQ-2 Total Score 2 0 0  PHQ-9 Total Score 3 4 0      DIAGNOSES:    ICD-10-CM   1. Treatment-resistant depression  F32.9     2. Generalized anxiety disorder  F41.1     3. Social anxiety disorder  F40.10     4. Smoker  F17.200      Receiving Psychotherapy: No   RECOMMENDATIONS:  PDMP was reviewed. Ativan filled 12/15/2022.  Also on  gabapentin. I provided 30 minutes of non-face-to-face time during this encounter, including time spent before and after the visit in records review, medical decision making, counseling pertinent to today's visit, and charting.   She is doing great.  No changes needed. Smoking cessation discussed.  Her first step will be to only allow smoking outside of her house.  Also consider nicotine gum, NicoDerm patches.  She is already on bupropion in the [place].  She also plans to discuss this with her PCP.  Continue Auvelity 45-105 mg, 1 p.o. twice daily. Continue Ativan 1 mg, 1 po qhs. Continue Zoloft 100 mg 2 pills daily.. Return in 6 months.  Melony Overly, PA-C

## 2023-02-04 ENCOUNTER — Telehealth: Payer: Self-pay | Admitting: Physician Assistant

## 2023-02-04 MED ORDER — AUVELITY 45-105 MG PO TBCR: 1.0000 | EXTENDED_RELEASE_TABLET | Freq: Two times a day (BID) | ORAL | 1 refills | Status: DC

## 2023-02-04 NOTE — Telephone Encounter (Signed)
Patient Victoria Mendez stating the insurance company is charging her $1000 for her medication. She stated the medication is medically necessary, and wasn't at that price point before. Please advise.  MyChart Video on 6/12, no follow up scheduled Contact # [phone]

## 2023-02-04 NOTE — Telephone Encounter (Signed)
Sent Rx to [facility].

## 2023-02-06 ENCOUNTER — Telehealth: Payer: Self-pay

## 2023-02-06 NOTE — Transitions of Care (Post Inpatient/ED Visit) (Signed)
   02/06/2023  Name: Victoria Mendez MRN: [id number] DOB: Dec 29, 1975  Today's TOC FU Call Status: Today's TOC FU Call Status:: Unsuccessul Call (1st Attempt) Unsuccessful Call (1st Attempt) Date: 02/06/23  Attempted to reach the patient regarding the most recent Inpatient/ED visit.  Follow Up Plan: Additional outreach attempts will be made to reach the patient to complete the Transitions of Care (Post Inpatient/ED visit) call.   Signature  TB,CMA

## 2023-02-10 ENCOUNTER — Ambulatory Visit: Payer: No Typology Code available for payment source | Admitting: Family Medicine

## 2023-02-10 ENCOUNTER — Telehealth: Payer: Self-pay | Admitting: Family Medicine

## 2023-02-10 ENCOUNTER — Other Ambulatory Visit (INDEPENDENT_AMBULATORY_CARE_PROVIDER_SITE_OTHER): Payer: No Typology Code available for payment source

## 2023-02-10 ENCOUNTER — Encounter: Payer: Self-pay | Admitting: Family Medicine

## 2023-02-10 VITALS — BP 118/76 | HR 77 | Temp 98.7°F | Ht 67.0 in | Wt 181.0 lb

## 2023-02-10 DIAGNOSIS — Z23 Encounter for immunization: Secondary | ICD-10-CM | POA: Diagnosis not present

## 2023-02-10 DIAGNOSIS — Z8669 Personal history of other diseases of the nervous system and sense organs: Secondary | ICD-10-CM | POA: Diagnosis not present

## 2023-02-10 DIAGNOSIS — E785 Hyperlipidemia, unspecified: Secondary | ICD-10-CM | POA: Diagnosis not present

## 2023-02-10 DIAGNOSIS — G453 Amaurosis fugax: Secondary | ICD-10-CM | POA: Diagnosis not present

## 2023-02-10 DIAGNOSIS — Z1211 Encounter for screening for malignant neoplasm of colon: Secondary | ICD-10-CM

## 2023-02-10 MED ORDER — ROSUVASTATIN CALCIUM 40 MG PO TABS
40.0000 mg | ORAL_TABLET | Freq: Every day | ORAL | 3 refills | Status: DC
Start: 2023-02-10 — End: 2023-12-16

## 2023-02-10 NOTE — Telephone Encounter (Signed)
LVM for pt to returned call regarding FNP msg:  Please let patient know to take Asprin 81 mg daily can buy it over the counter and I put in ordered for Ultrasound for her carotid artery   Well try calling pt again.

## 2023-02-10 NOTE — Assessment & Plan Note (Signed)
Previous LDL 178 on 11/04/22   LDL  not at goal , LDL Goal <70 Labs Ordered today Increase Crestor to 40 mg daily. Discussed lifestyle modifications follow diet low in saturated fat, reduce dietary salt intake, avoid fatty foods, maintain an exercise routine 3 to 5 days a week for a minimum total of 150 minutes.

## 2023-02-10 NOTE — Assessment & Plan Note (Signed)
Ordered US Carotid Bilateral  Amaurosis fugax possible due to Hyperlipidemia Repeat labs done today- increase Crestor 40 mg daily Advise patient to follow up with Opthalmology appointment in August Advise Asprin 81 mg daily

## 2023-02-10 NOTE — Progress Notes (Signed)
Patient Office Visit   Subjective   Patient ID: Victoria Mendez, female    DOB: 09/20/1975  Age: 47 y.o. MRN: [id number]  CC:  Chief Complaint  Patient presents with   Follow-up    Had a TIA on the 19th.    HPI Victoria Mendez 47 year old female, presents to the clinic for ER follow up for amaurosis fugax.  has a past medical history of Agoraphobia, Allergy, and Depression.For the details of today's visit, please refer to assessment and plan.   HPI    Outpatient Encounter Medications as of 02/10/2023  Medication Sig   Ascorbic Acid (VITAMIN C) 100 MG tablet Take 100 mg by mouth daily.   aspirin 81 MG chewable tablet Chew by mouth daily.   b complex vitamins capsule Take 1 capsule by mouth daily.   cholecalciferol (VITAMIN D3) 25 MCG (1000 UNIT) tablet Take 1,000 Units by mouth daily.   Cyanocobalamin (B-12 COMPLIANCE INJECTION IJ) Inject as directed.   Dextromethorphan-buPROPion ER (AUVELITY) 45-105 MG TBCR Take 1 tablet by mouth 2 (two) times daily.   estradiol (VIVELLE-DOT) 0.075 MG/24HR Place onto the skin.   gabapentin (NEURONTIN) 300 MG capsule Take 1 capsule (300 mg total) by mouth 3 (three) times daily as needed.   LORazepam (ATIVAN) 1 MG tablet Take 1 tablet (1 mg total) by mouth at bedtime.   methocarbamol (ROBAXIN) 500 MG tablet Take 1 tablet (500 mg total) by mouth 3 (three) times daily as needed for muscle spasms.   Multiple Vitamin (MULTIVITAMIN) capsule Take 1 capsule by mouth daily.   nicotine polacrilex (NICORETTE) 4 MG gum Take 4 mg by mouth as needed for smoking cessation.   pantoprazole (PROTONIX) 40 MG tablet Take 1 tablet (40 mg total) by mouth daily.   rosuvastatin (CRESTOR) 40 MG tablet Take 1 tablet (40 mg total) by mouth daily.   sertraline (ZOLOFT) 100 MG tablet Take 2 tablets (200 mg total) by mouth daily.   zinc gluconate 50 MG tablet Take 50 mg by mouth daily.   esomeprazole (NEXIUM) 40 MG capsule Take 40 mg by mouth daily at 12 noon. (Patient not  taking: Reported on 01/29/2023)   [DISCONTINUED] rosuvastatin (CRESTOR) 5 MG tablet Take 1 tablet (5 mg total) by mouth daily.   No facility-administered encounter medications on file as of 02/10/2023.    Past Surgical History:  Procedure Laterality Date   ABDOMINAL HYSTERECTOMY      Review of Systems  Constitutional:  Negative for chills and fever.  Eyes:  Negative for blurred vision, double vision and pain.  Respiratory:  Negative for shortness of breath.   Cardiovascular:  Negative for chest pain.  Gastrointestinal:  Negative for abdominal pain.  Genitourinary:  Negative for dysuria.  Musculoskeletal:  Negative for myalgias.  Neurological:  Negative for dizziness and headaches.      Objective    BP 118/76   Pulse 77   Temp 98.7 F (37.1 C) (Oral)   Ht 5\' 7"  (1.702 m)   Wt 181 lb 0.6 oz (82.1 kg)   LMP  (Approximate)   SpO2 96%   BMI 28.35 kg/m   Physical Exam Vitals reviewed.  Constitutional:      General: She is not in acute distress.    Appearance: Normal appearance. She is not ill-appearing, toxic-appearing or diaphoretic.  HENT:     Head: Normocephalic.  Eyes:     General:        Right eye: No discharge.  Left eye: No discharge.     Conjunctiva/sclera: Conjunctivae normal.  Cardiovascular:     Rate and Rhythm: Normal rate.     Pulses: Normal pulses.     Heart sounds: Normal heart sounds.  Pulmonary:     Effort: Pulmonary effort is normal. No respiratory distress.     Breath sounds: Normal breath sounds.  Musculoskeletal:        General: Normal range of motion.     Cervical back: Normal range of motion.  Skin:    General: Skin is warm and dry.     Capillary Refill: Capillary refill takes less than 2 seconds.  Neurological:     General: No focal deficit present.     Mental Status: She is alert and oriented to person, place, and time.     Coordination: Coordination normal.     Gait: Gait normal.  Psychiatric:        Mood and Affect: Mood  normal.        Behavior: Behavior normal.       Assessment & Plan:  Amaurosis fugax Assessment & Plan: Ordered US Carotid Bilateral  Amaurosis fugax possible due to Hyperlipidemia Repeat labs done today- increase Crestor 40 mg daily Advise patient to follow up with Opthalmology appointment in August Advise Asprin 81 mg daily  Orders: -     US Carotid Bilateral; Future  Hyperlipidemia, unspecified hyperlipidemia type Assessment & Plan: Previous LDL 178 on 11/04/22   LDL  not at goal , LDL Goal <70 Labs Ordered today Increase Crestor to 40 mg daily. Discussed lifestyle modifications follow diet low in saturated fat, reduce dietary salt intake, avoid fatty foods, maintain an exercise routine 3 to 5 days a week for a minimum total of 150 minutes.    Orders: -     Rosuvastatin Calcium; Take 1 tablet (40 mg total) by mouth daily.  Dispense: 90 tablet; Refill: 3 -     Lipid panel  History of sleep apnea -     Ambulatory referral to Sleep Studies  Screening for colon cancer -     Ambulatory referral to Gastroenterology -     Cologuard    Return in about 3 months (around 05/13/2023) for routine labs, hyperlipidemia.   [name], FNP

## 2023-02-10 NOTE — Patient Instructions (Signed)

## 2023-02-12 LAB — LIPID PANEL
Chol/HDL Ratio: 2.3 ratio (ref 0.0–4.4)
Cholesterol, Total: 152 mg/dL (ref 100–199)
HDL: 65 mg/dL (ref >39)
LDL Chol Calc (NIH): 70 mg/dL (ref 0–99)
Triglycerides: 92 mg/dL (ref 0–149)
VLDL Cholesterol Cal: 17 mg/dL (ref 5–40)

## 2023-02-13 ENCOUNTER — Encounter: Payer: Self-pay | Admitting: *Deleted

## 2023-02-25 LAB — COLOGUARD: COLOGUARD: NEGATIVE

## 2023-03-03 ENCOUNTER — Other Ambulatory Visit: Payer: Self-pay | Admitting: Physician Assistant

## 2023-03-07 ENCOUNTER — Other Ambulatory Visit: Payer: Self-pay | Admitting: Family Medicine

## 2023-03-07 DIAGNOSIS — G453 Amaurosis fugax: Secondary | ICD-10-CM

## 2023-03-11 ENCOUNTER — Other Ambulatory Visit: Payer: Self-pay | Admitting: Physician Assistant

## 2023-03-13 ENCOUNTER — Telehealth: Payer: Self-pay

## 2023-03-28 ENCOUNTER — Other Ambulatory Visit: Payer: Self-pay | Admitting: Family Medicine

## 2023-03-28 DIAGNOSIS — R12 Heartburn: Secondary | ICD-10-CM

## 2023-04-02 ENCOUNTER — Institutional Professional Consult (permissible substitution): Payer: No Typology Code available for payment source | Admitting: Pulmonary Disease

## 2023-05-01 ENCOUNTER — Other Ambulatory Visit (HOSPITAL_COMMUNITY): Payer: Self-pay | Admitting: Family Medicine

## 2023-05-01 DIAGNOSIS — Z1231 Encounter for screening mammogram for malignant neoplasm of breast: Secondary | ICD-10-CM

## 2023-05-08 ENCOUNTER — Encounter (INDEPENDENT_AMBULATORY_CARE_PROVIDER_SITE_OTHER): Payer: Self-pay | Admitting: *Deleted

## 2023-05-09 ENCOUNTER — Encounter (INDEPENDENT_AMBULATORY_CARE_PROVIDER_SITE_OTHER): Payer: Self-pay | Admitting: *Deleted

## 2023-05-12 ENCOUNTER — Ambulatory Visit (HOSPITAL_COMMUNITY)
Admission: RE | Admit: 2023-05-12 | Discharge: 2023-05-12 | Disposition: A | Discharge status: Home / Self Care | Payer: No Typology Code available for payment source | Source: Ambulatory Visit | Attending: Family Medicine | Admitting: Family Medicine

## 2023-05-12 ENCOUNTER — Other Ambulatory Visit: Payer: Self-pay | Admitting: Family Medicine

## 2023-05-12 ENCOUNTER — Encounter (HOSPITAL_COMMUNITY): Payer: Self-pay

## 2023-05-12 ENCOUNTER — Ambulatory Visit: Payer: No Typology Code available for payment source | Admitting: Adult Health

## 2023-05-12 ENCOUNTER — Ambulatory Visit: Payer: No Typology Code available for payment source | Admitting: Family Medicine

## 2023-05-12 ENCOUNTER — Encounter: Payer: Self-pay | Admitting: Adult Health

## 2023-05-12 ENCOUNTER — Encounter: Payer: Self-pay | Admitting: Family Medicine

## 2023-05-12 ENCOUNTER — Encounter: Payer: Self-pay | Admitting: *Deleted

## 2023-05-12 VITALS — BP 98/74 | HR 84 | Ht 67.0 in | Wt 184.0 lb

## 2023-05-12 VITALS — BP 106/73 | HR 76 | Resp 18 | Ht 67.0 in | Wt 182.0 lb

## 2023-05-12 DIAGNOSIS — G453 Amaurosis fugax: Secondary | ICD-10-CM

## 2023-05-12 DIAGNOSIS — R0683 Snoring: Secondary | ICD-10-CM | POA: Diagnosis not present

## 2023-05-12 DIAGNOSIS — R232 Flushing: Secondary | ICD-10-CM

## 2023-05-12 DIAGNOSIS — N951 Menopausal and female climacteric states: Secondary | ICD-10-CM | POA: Diagnosis not present

## 2023-05-12 DIAGNOSIS — Z1231 Encounter for screening mammogram for malignant neoplasm of breast: Secondary | ICD-10-CM | POA: Insufficient documentation

## 2023-05-12 DIAGNOSIS — E663 Overweight: Secondary | ICD-10-CM

## 2023-05-12 MED ORDER — SEMAGLUTIDE-WEIGHT MANAGEMENT 0.25 MG/0.5ML ~~LOC~~ SOAJ
0.2500 mg | SUBCUTANEOUS | 0 refills | Status: AC
Start: 2023-05-12 — End: 2023-06-09

## 2023-05-12 NOTE — Assessment & Plan Note (Signed)
Loud snoring, restless sleep, witnessed apneic events, daytime sleepiness and previous history of severe sleep apnea all concerning for ongoing sleep apnea. Would have liked ordered a CPAP titration study but unfortunately insurance will not cover and requires a repeat diagnostic study.  Will set up for a home sleep study.  - discussed how weight can impact sleep and risk for sleep disordered breathing - discussed options to assist with weight loss: combination of diet modification, cardiovascular and strength training exercises   - had an extensive discussion regarding the adverse health consequences related to untreated sleep disordered breathing - specifically discussed the risks for hypertension, coronary artery disease, cardiac dysrhythmias, cerebrovascular disease, and diabetes - lifestyle modification discussed   - discussed how sleep disruption can increase risk of accidents, particularly when driving - safe driving practices were discussed   Plan  Patient Instructions  Set up for home sleep study  Work on healthy weight loss  Do not drive if sleepy  Healthy sleep regimen  Follow up with Dr. Wynona Neat or Elisabet Gutzmer NP in 6 weeks and As needed

## 2023-05-12 NOTE — Patient Instructions (Addendum)
Set up for home sleep study  Work on healthy weight loss  Do not drive if sleepy  Healthy sleep regimen  Follow up with Dr. Wynona Neat or Barbara Ahart NP in 6 weeks and As needed

## 2023-05-12 NOTE — Patient Instructions (Signed)

## 2023-05-12 NOTE — Progress Notes (Addendum)
Patient Office Visit   Subjective   Patient ID: Victoria Mendez, female    DOB: 1976-01-24  Age: 47 y.o. MRN: [id number]  CC:  Chief Complaint  Patient presents with   Follow-up    HPI Victoria Mendez 47 year old female, presents to the clinic for hot flashes, hair loss and night sweats worsening since onset a few months ago. She  has a past medical history of Agoraphobia, Allergy, and Depression.For the details of today's visit, please refer to assessment and plan.   HPI    Outpatient Encounter Medications as of 05/12/2023  Medication Sig   Ascorbic Acid (VITAMIN C) 100 MG tablet Take 100 mg by mouth daily.   aspirin 81 MG chewable tablet Chew by mouth daily.   cholecalciferol (VITAMIN D3) 25 MCG (1000 UNIT) tablet Take 1,000 Units by mouth daily.   Dextromethorphan-buPROPion ER (AUVELITY) 45-105 MG TBCR Take 1 tablet by mouth 2 (two) times daily.   esomeprazole (NEXIUM) 40 MG capsule Take 40 mg by mouth daily at 12 noon. (Patient not taking: Reported on 01/29/2023)   LORazepam (ATIVAN) 1 MG tablet TAKE 1 TABLET BY MOUTH AT BEDTIME.   methocarbamol (ROBAXIN) 500 MG tablet Take 1 tablet (500 mg total) by mouth 3 (three) times daily as needed for muscle spasms.   pantoprazole (PROTONIX) 40 MG tablet TAKE 1 TABLET BY MOUTH EVERY DAY   rosuvastatin (CRESTOR) 40 MG tablet Take 1 tablet (40 mg total) by mouth daily.   sertraline (ZOLOFT) 100 MG tablet TAKE 2 TABLETS BY MOUTH EVERY DAY   [DISCONTINUED] b complex vitamins capsule Take 1 capsule by mouth daily.   [DISCONTINUED] Cyanocobalamin (B-12 COMPLIANCE INJECTION IJ) Inject as directed.   [DISCONTINUED] estradiol (VIVELLE-DOT) 0.075 MG/24HR Place onto the skin.   [DISCONTINUED] gabapentin (NEURONTIN) 300 MG capsule Take 1 capsule (300 mg total) by mouth 3 (three) times daily as needed.   [DISCONTINUED] Multiple Vitamin (MULTIVITAMIN) capsule Take 1 capsule by mouth daily.   [DISCONTINUED] nicotine polacrilex (NICORETTE) 4 MG gum  Take 4 mg by mouth as needed for smoking cessation.   [DISCONTINUED] zinc gluconate 50 MG tablet Take 50 mg by mouth daily.   No facility-administered encounter medications on file as of 05/12/2023.    Past Surgical History:  Procedure Laterality Date   ABDOMINAL HYSTERECTOMY      Review of Systems  Constitutional:  Positive for diaphoresis. Negative for chills and fever.  Eyes:  Negative for blurred vision.  Respiratory:  Negative for shortness of breath.   Cardiovascular:  Negative for chest pain.  Gastrointestinal:  Negative for abdominal pain.  Genitourinary:  Negative for dysuria.  Neurological:  Negative for dizziness and headaches.      Objective    BP 106/73   Pulse 76   Resp 18   Ht 5\' 7"  (1.702 m)   Wt 182 lb (82.6 kg)   SpO2 98%   BMI 28.51 kg/m   Physical Exam Vitals reviewed.  Constitutional:      General: She is not in acute distress.    Appearance: Normal appearance. She is not ill-appearing, toxic-appearing or diaphoretic.  HENT:     Head: Normocephalic.  Eyes:     General:        Right eye: No discharge.        Left eye: No discharge.     Conjunctiva/sclera: Conjunctivae normal.  Cardiovascular:     Rate and Rhythm: Normal rate.     Pulses: Normal pulses.  Heart sounds: Normal heart sounds.  Pulmonary:     Effort: Pulmonary effort is normal. No respiratory distress.     Breath sounds: Normal breath sounds.  Musculoskeletal:        General: Normal range of motion.     Cervical back: Normal range of motion.  Skin:    General: Skin is warm.     Capillary Refill: Capillary refill takes less than 2 seconds.  Neurological:     Mental Status: She is alert.     Coordination: Coordination normal.     Gait: Gait normal.  Psychiatric:        Mood and Affect: Mood normal.        Behavior: Behavior normal.       Assessment & Plan:  Hot flashes -     Estrogens, total -     Cortisol -     Ambulatory referral to Obstetrics /  Gynecology  Amaurosis fugax, both eyes -     US Carotid Bilateral; Future  Hot flashes, menopausal Assessment & Plan: Patient wants to start estrogen therapy, Labs ordered in today's visit Advise avoiding triggers such as spicy foods, caffeine, and alcohol may help relieve hot flashes. Dressing in lightweight, removable layers and losing weight.      Return in about 3 months (around 08/11/2023), or if symptoms worsen or fail to improve, for hyperlipidemia.   [name], FNP

## 2023-05-12 NOTE — Progress Notes (Signed)
@Patient  ID: Victoria Mendez, female    DOB: 1975-09-02, 47 y.o.   MRN: [id number]  Chief Complaint  Patient presents with   Consult    Referring provider: Wylene Men*  HPI: 47 year old female seen for sleep consult May 12, 2023 for snoring and daytime sleepiness   TEST/EVENTS :   05/12/2023 Sleep consult  Patient presents for sleep consult today.  Patient complains of loud snoring, witnessed apneic events, restless sleep and daytime sleepiness.  Says that she was diagnosed with severe sleep apnea in 2022.  She was recommended to begin CPAP but unfortunately order was not sent and she was never able to start on CPAP .  Goes to bed at 10 PM to 1 AM.  Takes up to 2 hours to go to sleep.  Is up several times throughout the night.  Gets up at 8:30 AM.  Weight is up 20 pounds over the last 2 years, current weight is at 184 pounds with a BMI at 28.  Denies any history of congestive heart failure or stroke.  Does not use any sleep aids.  Naps on occasion.   No symptoms suspicious for cataplexy or sleep paralysis.  Sleep study December 05, 2020 showed severe sleep apnea with AHI at 35.3/hour and SpO2 at 85%. Drinks 3 [employer] Frappuccino daily .  She complains that she continues to have ongoing sleep issues that is very restless.  She feels tired all the time.Epworth score is 4 out of 24.  Gets sleepy if she sits down to watch TV and in the ED and hours  Medical history significant for GERD, chronic headaches, hyperlipidemia, anxiety  Surgical history status post hysterectomy in 2003  Social history patient is a former smoker.  No alcohol or drug use.  Lives at home with her husband and 2 sons.  She works full-time as a Warehouse manager.  Family history positive for emphysema, allergies, heart disease, cancer, sleep apnea  Allergies  Allergen Reactions   Penicillins Hives    Immunization History  Administered Date(s) Administered   Influenza,inj,Quad PF,6+ Mos  05/29/2019   Janssen (J&J) SARS-COV-2 Vaccination 10/28/2018, 03/09/2020   Tdap 02/10/2023    Past Medical History:  Diagnosis Date   Agoraphobia    Allergy    Depression     Tobacco History: Social History   Tobacco Use  Smoking Status Former   Types: Cigarettes   Start date: 02/01/2023   Quit date: 03/02/2021   Years since quitting: 2.1  Smokeless Tobacco Never  Tobacco Comments   Quit smoking 96 day ago.  05/12/2023 hfb   Counseling given: Not Answered Tobacco comments: Quit smoking 96 day ago.  05/12/2023 hfb   Outpatient Medications Prior to Visit  Medication Sig Dispense Refill   Ascorbic Acid (VITAMIN C) 100 MG tablet Take 100 mg by mouth daily.     aspirin 81 MG chewable tablet Chew by mouth daily.     cholecalciferol (VITAMIN D3) 25 MCG (1000 UNIT) tablet Take 1,000 Units by mouth daily.     Dextromethorphan-buPROPion ER (AUVELITY) 45-105 MG TBCR Take 1 tablet by mouth 2 (two) times daily. 180 tablet 1   esomeprazole (NEXIUM) 40 MG capsule Take 40 mg by mouth daily at 12 noon.     LORazepam (ATIVAN) 1 MG tablet TAKE 1 TABLET BY MOUTH AT BEDTIME. 90 tablet 1   methocarbamol (ROBAXIN) 500 MG tablet Take 1 tablet (500 mg total) by mouth 3 (three) times daily as needed for muscle  spasms. 30 tablet 1   pantoprazole (PROTONIX) 40 MG tablet TAKE 1 TABLET BY MOUTH EVERY DAY 90 tablet 0   rosuvastatin (CRESTOR) 40 MG tablet Take 1 tablet (40 mg total) by mouth daily. 90 tablet 3   sertraline (ZOLOFT) 100 MG tablet TAKE 2 TABLETS BY MOUTH EVERY DAY 180 tablet 3   No facility-administered medications prior to visit.     Review of Systems:   Constitutional:   No  weight loss, night sweats,  Fevers, chills,  +fatigue, or  lassitude.  HEENT:   No headaches,  Difficulty swallowing,  Tooth/dental problems, or  Sore throat,                No sneezing, itching, ear ache, nasal congestion, post nasal drip,   CV:  No chest pain,  Orthopnea, PND, swelling in lower extremities,  anasarca, dizziness, palpitations, syncope.   GI  No heartburn, indigestion, abdominal pain, nausea, vomiting, diarrhea, change in bowel habits, loss of appetite, bloody stools.   Resp: No shortness of breath with exertion or at rest.  No excess mucus, no productive cough,  No non-productive cough,  No coughing up of blood.  No change in color of mucus.  No wheezing.  No chest wall deformity  Skin: no rash or lesions.  GU: no dysuria, change in color of urine, no urgency or frequency.  No flank pain, no hematuria   MS:  No joint pain or swelling.  No decreased range of motion.  No back pain.    Physical Exam  BP 98/74 (BP Location: Left Arm, Patient Position: Sitting, Cuff Size: Normal)   Pulse 84   Ht 5\' 7"  (1.702 m)   Wt 184 lb (83.5 kg)   SpO2 100%   BMI 28.82 kg/m   GEN: A/Ox3; pleasant , NAD, well nourished    HEENT:  [place]/AT,  , NOSE-clear, THROAT-clear, no lesions, no postnasal drip or exudate noted. Class 3 MP airway   NECK:  Supple w/ fair ROM; no JVD; normal carotid impulses w/o bruits; no thyromegaly or nodules palpated; no lymphadenopathy.    RESP  Clear  P & A; w/o, wheezes/ rales/ or rhonchi. no accessory muscle use, no dullness to percussion  CARD:  RRR, no m/r/g, no peripheral edema, pulses intact, no cyanosis or clubbing.  GI:   Soft & nt; nml bowel sounds; no organomegaly or masses detected.   Musco: Warm bil, no deformities or joint swelling noted.   Neuro: alert, no focal deficits noted.    Skin: Warm, no lesions or rashes    Lab Results:  CBC    BNP No results found for: "BNP"  ProBNP No results found for: "PROBNP"  Imaging: No results found.  Administration History     None           No data to display          No results found for: "NITRICOXIDE"      Assessment & Plan:   Snoring Loud snoring, restless sleep, witnessed apneic events, daytime sleepiness and previous history of severe sleep apnea all concerning for  ongoing sleep apnea. Would have liked ordered a CPAP titration study but unfortunately insurance will not cover and requires a repeat diagnostic study.  Will set up for a home sleep study.  - discussed how weight can impact sleep and risk for sleep disordered breathing - discussed options to assist with weight loss: combination of diet modification, cardiovascular and strength training exercises   -  had an extensive discussion regarding the adverse health consequences related to untreated sleep disordered breathing - specifically discussed the risks for hypertension, coronary artery disease, cardiac dysrhythmias, cerebrovascular disease, and diabetes - lifestyle modification discussed   - discussed how sleep disruption can increase risk of accidents, particularly when driving - safe driving practices were discussed   Plan  Patient Instructions  Set up for home sleep study  Work on healthy weight loss  Do not drive if sleepy  Healthy sleep regimen  Follow up with Dr. Wynona Neat or Zahava Quant NP in 6 weeks and As needed        Rubye Oaks, NP 05/12/2023

## 2023-05-12 NOTE — Assessment & Plan Note (Addendum)
Patient wants to start estrogen therapy, Labs ordered in today's visit Advise avoiding triggers such as spicy foods, caffeine, and alcohol may help relieve hot flashes. Dressing in lightweight, removable layers and losing weight.

## 2023-05-12 NOTE — Addendum Note (Signed)
Addended by: Delrae Rend on: 05/12/2023 04:15 PM   Modules accepted: Orders

## 2023-05-13 ENCOUNTER — Ambulatory Visit: Payer: No Typology Code available for payment source | Admitting: Family Medicine

## 2023-05-14 LAB — ESTROGENS, TOTAL: Estrogen: 61 pg/mL

## 2023-05-14 LAB — CORTISOL: Cortisol: 7 ug/dL (ref 6.2–19.4)

## 2023-05-19 NOTE — Telephone Encounter (Signed)
Previously addressed.

## 2023-05-29 ENCOUNTER — Encounter: Payer: No Typology Code available for payment source | Admitting: Obstetrics & Gynecology

## 2023-06-08 DIAGNOSIS — R0683 Snoring: Secondary | ICD-10-CM

## 2023-06-19 ENCOUNTER — Encounter: Payer: No Typology Code available for payment source | Admitting: Obstetrics & Gynecology

## 2023-06-20 ENCOUNTER — Ambulatory Visit (INDEPENDENT_AMBULATORY_CARE_PROVIDER_SITE_OTHER): Payer: No Typology Code available for payment source | Admitting: Physician Assistant

## 2023-06-20 ENCOUNTER — Encounter: Payer: Self-pay | Admitting: Physician Assistant

## 2023-06-20 DIAGNOSIS — F4010 Social phobia, unspecified: Secondary | ICD-10-CM

## 2023-06-20 DIAGNOSIS — G4700 Insomnia, unspecified: Secondary | ICD-10-CM | POA: Diagnosis not present

## 2023-06-20 DIAGNOSIS — Z87891 Personal history of nicotine dependence: Secondary | ICD-10-CM

## 2023-06-20 DIAGNOSIS — F329 Major depressive disorder, single episode, unspecified: Secondary | ICD-10-CM | POA: Diagnosis not present

## 2023-06-20 DIAGNOSIS — F411 Generalized anxiety disorder: Secondary | ICD-10-CM

## 2023-06-20 NOTE — Progress Notes (Signed)
[facility]  Patient ID: Victoria Mendez,  MRN: [id number]  PCP: [name], FNP  Date of Evaluation: 06/20/2023 Time spent:25 minutes  Chief Complaint:  Chief Complaint   Anxiety; Depression; Follow-up    Virtual Visit via Telehealth  I connected with patient by telephone, with their informed consent, and verified patient privacy and that I am speaking with the correct person using two identifiers.  I am private, in my office and the patient is at work.  I discussed the limitations, risks, security and privacy concerns of performing an evaluation and management service by telephone and the availability of in person appointments. I also discussed with the patient that there may be a patient responsible charge related to this service. The patient expressed understanding and agreed to proceed.   I discussed the assessment and treatment plan with the patient. The patient was provided an opportunity to ask questions and all were answered. The patient agreed with the plan and demonstrated an understanding of the instructions.   The patient was advised to call back or seek an in-person evaluation if the symptoms worsen or if the condition fails to improve as anticipated.  I provided 25 minutes of non-face-to-face time during this encounter.  HISTORY/CURRENT STATUS: HPI for routine med check.  Doing really well. Loves the [employer]. It's the best drug she's ever taken. Patient is able to enjoy things.  Energy and motivation are good.  Work is going well.   No extreme sadness, tearfulness, or feelings of hopelessness.  Sleeps fairly well. Did a sleep study recently, might need CPAP.   ADLs and personal hygiene are normal.   Denies any changes in concentration, making decisions, or remembering things.  Appetite has not changed.  Weight is stable.  She does have anxiety at times, takes the Ativan when she gets too overwhelmed, it is effective.  Denies suicidal or  homicidal thoughts.  Patient denies increased energy with decreased need for sleep, increased talkativeness, racing thoughts, impulsivity or risky behaviors, increased spending, increased libido, grandiosity, increased irritability or anger, paranoia, or hallucinations.  Denies dizziness, syncope, seizures, numbness, tingling, tremor, tics, unsteady gait, slurred speech, confusion. Denies muscle or joint pain, stiffness, or dystonia.  Individual Medical History/ Review of Systems: Changes? :Yes  went to ER 01/2023, possible TIA Having 17 teeth removed next week.    Past medications for mental health diagnoses include: Prozac, Pristiq, Ativan, Buspar, Wellbutrin (over 150 mg caused her to cry more)  Hydroxyzine, Zoloft  Allergies: Penicillins  Current Medications:  Current Outpatient Medications:    Dextromethorphan-buPROPion ER (AUVELITY) 45-105 MG TBCR, Take 1 tablet by mouth 2 (two) times daily., Disp: 180 tablet, Rfl: 1   LORazepam (ATIVAN) 1 MG tablet, TAKE 1 TABLET BY MOUTH AT BEDTIME., Disp: 90 tablet, Rfl: 1   pantoprazole (PROTONIX) 40 MG tablet, TAKE 1 TABLET BY MOUTH EVERY DAY, Disp: 90 tablet, Rfl: 0   rosuvastatin (CRESTOR) 40 MG tablet, Take 1 tablet (40 mg total) by mouth daily., Disp: 90 tablet, Rfl: 3   sertraline (ZOLOFT) 100 MG tablet, TAKE 2 TABLETS BY MOUTH EVERY DAY, Disp: 180 tablet, Rfl: 3   Ascorbic Acid (VITAMIN C) 100 MG tablet, Take 100 mg by mouth daily. (Patient not taking: Reported on 06/20/2023), Disp: , Rfl:    aspirin 81 MG chewable tablet, Chew by mouth daily. (Patient not taking: Reported on 06/20/2023), Disp: , Rfl:    cholecalciferol (VITAMIN D3) 25 MCG (1000 UNIT) tablet, Take 1,000 Units by mouth  daily. (Patient not taking: Reported on 06/20/2023), Disp: , Rfl:    esomeprazole (NEXIUM) 40 MG capsule, Take 40 mg by mouth daily at 12 noon. (Patient not taking: Reported on 06/20/2023), Disp: , Rfl:    methocarbamol (ROBAXIN) 500 MG tablet, Take 1 tablet (500 mg  total) by mouth 3 (three) times daily as needed for muscle spasms. (Patient not taking: Reported on 06/20/2023), Disp: 30 tablet, Rfl: 1 Medication Side Effects: none  Family Medical/ Social History: Changes? no  MENTAL HEALTH EXAM:  There were no vitals taken for this visit.There is no height or weight on file to calculate BMI.  General Appearance:  Unable to assess  Eye Contact:   Unable to assess  Speech:  Clear and Coherent and Normal Rate  Volume:  Normal  Mood:  Euthymic  Affect:   Unable to assess  Thought Process:  Goal Directed and Descriptions of Associations: Circumstantial  Orientation:  Full (Time, Place, and Person)  Thought Content: Logical   Suicidal Thoughts:  No  Homicidal Thoughts:  No  Memory:  WNL  Judgement:  Good  Insight:  Good  Psychomotor Activity:   Unable to assess  Concentration:  Concentration: Good and Attention Span: Good  Recall:  Good  Fund of Knowledge: Good  Language: Good  Assets:  Desire for Improvement Financial Resources/Insurance Housing Transportation Vocational/Educational  ADL's:  Intact  Cognition: WNL  Prognosis:  Good   ECT-MADRS    Flowsheet Row Video Visit from 08/09/2022 in [facility]  MADRS Total Score 45      GAD-7    Flowsheet Row Office Visit from 05/12/2023 in [facility] Office Visit from 02/10/2023 in [facility] Office Visit from 01/01/2023 in [facility] Office Visit from 11/14/2022 in [facility] Office Visit from 11/04/2022 in [facility]  Total GAD-7 Score 0 4 7 2 8       PHQ2-9    Flowsheet Row Office Visit from 05/12/2023 in [facility] Office Visit from 02/10/2023 in [facility] Office Visit from 01/01/2023 in [facility] Office Visit from 11/14/2022 in [facility]  Office Visit from 11/04/2022 in [facility]  PHQ-2 Total Score 1 0 2 0 0  PHQ-9 Total Score 6 0 3 4 0      DIAGNOSES:    ICD-10-CM   1. Treatment-resistant depression  F32.9     2. Generalized anxiety disorder  F41.1     3. Social anxiety disorder  F40.10     4. Former smoker  Z87.891     5. Insomnia, unspecified type  G47.00       Receiving Psychotherapy: No   RECOMMENDATIONS:  PDMP was reviewed. Ativan filled 06/10/2023. also on gabapentin. I provided 25 minutes of non-face-to-face time during this encounter, including time spent before and after the visit in records review, medical decision making, counseling pertinent to today's visit, and charting.   Jaelyn is doing great so no treatment changes are necessary.  She asked about taking Valium that will be prescribed by her oral surgeon next week before the procedure.  She wonders whether we could do something with the Ativan and not take the Valium.  I recommend holding back Ativan and taking the Valium that they give her, it is more sedating which I think would be more relaxing during a procedure  like that.  She will let me know if they want a note showing we talked about it.  Continue Auvelity 45-105 mg, 1 p.o. twice daily. Continue Ativan 1 mg, 1 po qhs. Continue Zoloft 100 mg 2 pills daily.. Return in 3 months.  Melony Overly, PA-C

## 2023-06-25 ENCOUNTER — Telehealth: Payer: Self-pay | Admitting: Physician Assistant

## 2023-06-25 ENCOUNTER — Other Ambulatory Visit: Payer: Self-pay

## 2023-06-25 ENCOUNTER — Other Ambulatory Visit: Payer: Self-pay | Admitting: Family Medicine

## 2023-06-25 DIAGNOSIS — R12 Heartburn: Secondary | ICD-10-CM

## 2023-06-25 MED ORDER — DIAZEPAM 5 MG PO TABS: ORAL_TABLET | ORAL | 0 refills | Status: DC

## 2023-06-25 NOTE — Telephone Encounter (Signed)
Pt called at 9:27a.  She is due to have surgery tomorrow.  She said Victoria Mendez wanted her to not take ativan and instead let them use the valium.  The doctors are saying they want her to prescribe the Valium, since she prescribes the Ativan.  She needs to talk to someone today   No upcoming appt scheduled.

## 2023-06-25 NOTE — Telephone Encounter (Signed)
Please see message and advise. I can pend as appropriate.

## 2023-06-25 NOTE — Telephone Encounter (Signed)
Pended. LVM to RC.

## 2023-06-25 NOTE — Telephone Encounter (Signed)
Please pend Valium 5 mg, #4, 1 po approx 2 hours prior to dental work. May repeat in 1 hour if anxiety is not controlled.  After the procedure, take 1 p.o. every 6 hours as needed anxiety.  Do not mix with Ativan.  Resume Ativan the day after dental procedure.

## 2023-07-01 DIAGNOSIS — G4733 Obstructive sleep apnea (adult) (pediatric): Secondary | ICD-10-CM

## 2023-07-14 ENCOUNTER — Telehealth (HOSPITAL_BASED_OUTPATIENT_CLINIC_OR_DEPARTMENT_OTHER): Payer: Self-pay

## 2023-07-14 NOTE — Telephone Encounter (Signed)
LMOM for patient to return call.

## 2023-07-14 NOTE — Telephone Encounter (Signed)
Patient reached out. She now has the results to her HST. She is scheduled with Dr Wynona Neat on 12/20. Is that soon enough or do you want her sooner with you?

## 2023-07-14 NOTE — Telephone Encounter (Signed)
I can see in person or video on Wednesday if she wants to double book to go over  She has Severe OSA- if can't come in can start on CPAP 5-15 cmH2O. Can see in 2 months for follow up    Please contact office for sooner follow up if symptoms do not improve or worsen or seek emergency care

## 2023-07-15 ENCOUNTER — Encounter: Payer: Self-pay | Admitting: *Deleted

## 2023-07-15 NOTE — Telephone Encounter (Signed)
ATC home # x1.  No answer, no VM came on, unable to leave a VM.  Called [phone] and was told I had the wrong person (right number, however, this is not the correct # for the patient).  Mychart message sent to patient.

## 2023-08-07 NOTE — Progress Notes (Deleted)
   Virtual Visit via Video Note  I connected with Victoria Mendez on 08/07/23 at  1:20 PM EST by a video enabled telemedicine application and verified that I am speaking with the correct person using two identifiers.  {Patient Location:[phone]::"Home"} {Provider [phone]::"Home Office"}  I discussed the limitations, risks, security, and privacy concerns of performing an evaluation and management service by video and the availability of in person appointments. I also discussed with the patient that there may be a patient responsible charge related to this service. The patient expressed understanding and agreed to proceed.  Subjective: PCP: [name], FNP  No chief complaint on file.  HPI   ROS: Per HPI  Current Outpatient Medications:    Ascorbic Acid (VITAMIN C) 100 MG tablet, Take 100 mg by mouth daily. (Patient not taking: Reported on 06/20/2023), Disp: , Rfl:    aspirin 81 MG chewable tablet, Chew by mouth daily. (Patient not taking: Reported on 06/20/2023), Disp: , Rfl:    cholecalciferol (VITAMIN D3) 25 MCG (1000 UNIT) tablet, Take 1,000 Units by mouth daily. (Patient not taking: Reported on 06/20/2023), Disp: , Rfl:    Dextromethorphan-buPROPion ER (AUVELITY) 45-105 MG TBCR, Take 1 tablet by mouth 2 (two) times daily., Disp: 180 tablet, Rfl: 1   diazepam (VALIUM) 5 MG tablet, 1 po approx 2 hours prior to dental work. May repeat in 1 hour if anxiety not controlled. After procedure, take 1 p.o. every 6 hours as needed anxiety. Do not mix with Ativan., Disp: 4 tablet, Rfl: 0   esomeprazole (NEXIUM) 40 MG capsule, Take 40 mg by mouth daily at 12 noon. (Patient not taking: Reported on 06/20/2023), Disp: , Rfl:    LORazepam (ATIVAN) 1 MG tablet, TAKE 1 TABLET BY MOUTH AT BEDTIME., Disp: 90 tablet, Rfl: 1   methocarbamol (ROBAXIN) 500 MG tablet, Take 1 tablet (500 mg total) by mouth 3 (three) times daily as needed for muscle spasms. (Patient not taking: Reported on  06/20/2023), Disp: 30 tablet, Rfl: 1   pantoprazole (PROTONIX) 40 MG tablet, TAKE 1 TABLET BY MOUTH EVERY DAY, Disp: 90 tablet, Rfl: 0   rosuvastatin (CRESTOR) 40 MG tablet, Take 1 tablet (40 mg total) by mouth daily., Disp: 90 tablet, Rfl: 3   sertraline (ZOLOFT) 100 MG tablet, TAKE 2 TABLETS BY MOUTH EVERY DAY, Disp: 180 tablet, Rfl: 3  Observations/Objective: There were no vitals filed for this visit. Physical Exam  Assessment and Plan: There are no diagnoses linked to this encounter.  Follow Up Instructions: No follow-ups on file.   I discussed the assessment and treatment plan with the patient. The patient was provided an opportunity to ask questions, and all were answered. The patient agreed with the plan and demonstrated an understanding of the instructions.   The patient was advised to call back or seek an in-person evaluation if the symptoms worsen or if the condition fails to improve as anticipated.  The above assessment and management plan was discussed with the patient. The patient verbalized understanding of and has agreed to the management plan.   [name], FNP

## 2023-08-08 ENCOUNTER — Ambulatory Visit: Payer: No Typology Code available for payment source | Admitting: Family Medicine

## 2023-08-08 ENCOUNTER — Encounter (INDEPENDENT_AMBULATORY_CARE_PROVIDER_SITE_OTHER): Payer: No Typology Code available for payment source | Admitting: Family Medicine

## 2023-08-08 DIAGNOSIS — E782 Mixed hyperlipidemia: Secondary | ICD-10-CM

## 2023-08-08 NOTE — Progress Notes (Signed)
Canceled.

## 2023-08-11 ENCOUNTER — Telehealth: Payer: No Typology Code available for payment source | Admitting: Pulmonary Disease

## 2023-08-11 DIAGNOSIS — F330 Major depressive disorder, recurrent, mild: Secondary | ICD-10-CM

## 2023-08-11 DIAGNOSIS — F331 Major depressive disorder, recurrent, moderate: Secondary | ICD-10-CM

## 2023-08-11 DIAGNOSIS — G4733 Obstructive sleep apnea (adult) (pediatric): Secondary | ICD-10-CM | POA: Diagnosis not present

## 2023-08-11 NOTE — Progress Notes (Signed)
Virtual Visit via Video Note  I connected with Victoria Mendez on 08/11/23 at  4:00 PM EST by a video enabled telemedicine application and verified that I am speaking with the correct person using two identifiers.  Location: Patient: Patient was at home Provider: In office, [address]   I discussed the limitations of evaluation and management by telemedicine and the availability of in person appointments. The patient expressed understanding and agreed to proceed.  History of Present Illness: Patient being seen for follow-up for severe obstructive sleep apnea  Recently had a sleep study performed showing severe obstructive sleep apnea with AHI of 32.8, O2 nadir of 80%  This is comparable to a sleep study she had done in May 2022 with an AHI of 35.3  Sleep habits have not changed, goes to bed about 10 PM, Takes a couple of hours to fall asleep up several times during the night Final wake up time about 8:30 AM Weight is up about 20 pounds the last year Occasional daytime naps  Drinks caffeinated beverages daily to stay alert  History of chronic headaches, hyperlipidemia, anxiety  Reformed smoker   Observations/Objective:  She looks well, does not appear to be in distress  Sleep study was reviewed showing severe obstructive sleep apnea with mild oxygen desaturations Recommendation.  CPAP therapy.  Sleep study was comparable to 1 performed from 2022  Assessment and Plan:  DME referral for auto CPAP  Auto CPAP 5-15 with heated humidification with patient's mask of choice  Tentative follow-up in about 3 months  Encouraged to call with significant concerns   Follow Up Instructions:    I discussed the assessment and treatment plan with the patient. The patient was provided an opportunity to ask questions and all were answered. The patient agreed with the plan and demonstrated an understanding of the instructions.   The patient was advised to call back or seek an  in-person evaluation if the symptoms worsen or if the condition fails to improve as anticipated.  I provided 21 minutes of non-face-to-face time during this encounter.   Tomma Lightning, MD

## 2023-08-11 NOTE — Patient Instructions (Addendum)
DME referral for CPAP set up  Auto CPAP 5-15 with heated humidification with patient's mask of choice  Follow-up in about 3 months for compliance  Call with significant concerns  Continue weight loss efforts

## 2023-08-12 NOTE — Progress Notes (Unsigned)
Virtual Visit via Video Note  I connected with Victoria Mendez on 08/14/23 at  2:00 PM EST by a video enabled telemedicine application and verified that I am speaking with the correct person using two identifiers.  Patient Location: Home Provider Location: Office/Clinic  I discussed the limitations, risks, security, and privacy concerns of performing an evaluation and management service by video and the availability of in person appointments. I also discussed with the patient that there may be a patient responsible charge related to this service. The patient expressed understanding and agreed to proceed.  Subjective: PCP: [name], FNP  Chief Complaint  Patient presents with   Care Management    3 month f/u   Victoria Mendez 47 year old female present via Telehealth for chronic follow up. For the details of today's visit, please refer to assessment and plan.      ROS: Per HPI  Current Outpatient Medications:    Ascorbic Acid (VITAMIN C) 100 MG tablet, Take 100 mg by mouth daily., Disp: , Rfl:    aspirin 81 MG chewable tablet, Chew by mouth daily., Disp: , Rfl:    cholecalciferol (VITAMIN D3) 25 MCG (1000 UNIT) tablet, Take 1,000 Units by mouth daily., Disp: , Rfl:    Dextromethorphan-buPROPion ER (AUVELITY) 45-105 MG TBCR, Take 1 tablet by mouth 2 (two) times daily., Disp: 180 tablet, Rfl: 1   diazepam (VALIUM) 5 MG tablet, 1 po approx 2 hours prior to dental work. May repeat in 1 hour if anxiety not controlled. After procedure, take 1 p.o. every 6 hours as needed anxiety. Do not mix with Ativan., Disp: 4 tablet, Rfl: 0   esomeprazole (NEXIUM) 40 MG capsule, Take 40 mg by mouth daily at 12 noon., Disp: , Rfl:    LORazepam (ATIVAN) 1 MG tablet, TAKE 1 TABLET BY MOUTH AT BEDTIME., Disp: 90 tablet, Rfl: 1   methocarbamol (ROBAXIN) 500 MG tablet, Take 1 tablet (500 mg total) by mouth 3 (three) times daily as needed for muscle spasms., Disp: 30 tablet, Rfl: 1    pantoprazole (PROTONIX) 40 MG tablet, TAKE 1 TABLET BY MOUTH EVERY DAY, Disp: 90 tablet, Rfl: 0   rosuvastatin (CRESTOR) 40 MG tablet, Take 1 tablet (40 mg total) by mouth daily., Disp: 90 tablet, Rfl: 3   sertraline (ZOLOFT) 100 MG tablet, TAKE 2 TABLETS BY MOUTH EVERY DAY, Disp: 180 tablet, Rfl: 3  Observations/Objective: Today's Vitals   08/14/23 1324  Weight: 175 lb (79.4 kg)  Height: 5\' 8"  (1.727 m)   Physical Exam Patient is alert and no acute distress noted.   Assessment and Plan: Mixed hyperlipidemia Assessment & Plan: Previous LDL 70  Controlled on  Crestor to 40 mg daily. Discussed lifestyle modifications follow diet low in saturated fat, reduce dietary salt intake, avoid fatty foods, maintain an exercise routine 3 to 5 days a week for a minimum total of 150 minutes.  Labs ordered.  Orders: -     Lipid panel -     BMP8+eGFR -     CBC with Differential/Platelet  Vitamin D deficiency -     VITAMIN D 25 Hydroxy (Vit-D Deficiency, Fractures)  Iron deficiency anemia, unspecified iron deficiency anemia type -     Vitamin B12 -     Iron, TIBC and Ferritin Panel  Heartburn Assessment & Plan: Controlled on Protonix 40 mg PRN Explained to patient lifestyle modifications, weight loss, smoking cessation, loose clothing, and avoiding caffeine and trigger foods.  Follow Up Instructions: No follow-ups on file.   I discussed the assessment and treatment plan with the patient. The patient was provided an opportunity to ask questions, and all were answered. The patient agreed with the plan and demonstrated an understanding of the instructions.   The patient was advised to call back or seek an in-person evaluation if the symptoms worsen or if the condition fails to improve as anticipated.  The above assessment and management plan was discussed with the patient. The patient verbalized understanding of and has agreed to the management plan.   [name], FNP

## 2023-08-14 ENCOUNTER — Telehealth: Payer: Self-pay

## 2023-08-14 ENCOUNTER — Encounter: Payer: Self-pay | Admitting: Family Medicine

## 2023-08-14 ENCOUNTER — Telehealth (INDEPENDENT_AMBULATORY_CARE_PROVIDER_SITE_OTHER): Payer: No Typology Code available for payment source | Admitting: Family Medicine

## 2023-08-14 VITALS — Ht 68.0 in | Wt 175.0 lb

## 2023-08-14 DIAGNOSIS — E782 Mixed hyperlipidemia: Secondary | ICD-10-CM | POA: Diagnosis not present

## 2023-08-14 DIAGNOSIS — E559 Vitamin D deficiency, unspecified: Secondary | ICD-10-CM

## 2023-08-14 DIAGNOSIS — R12 Heartburn: Secondary | ICD-10-CM

## 2023-08-14 DIAGNOSIS — D509 Iron deficiency anemia, unspecified: Secondary | ICD-10-CM | POA: Diagnosis not present

## 2023-08-14 NOTE — Telephone Encounter (Signed)
Copied from CRM [id number]. Topic: General - Other >> Aug 14, 2023  1:37 PM Carlatta H wrote: Reason for CRM: Patient received a call about her Teleheatlh visit today at @2 //Please return patient call

## 2023-08-14 NOTE — Telephone Encounter (Signed)
Patient completed video visit.

## 2023-08-14 NOTE — Assessment & Plan Note (Signed)
Controlled on Protonix 40 mg PRN Explained to patient lifestyle modifications, weight loss, smoking cessation, loose clothing, and avoiding caffeine and trigger foods.

## 2023-08-14 NOTE — Assessment & Plan Note (Addendum)
Previous LDL 70  Controlled on  Crestor to 40 mg daily. Discussed lifestyle modifications follow diet low in saturated fat, reduce dietary salt intake, avoid fatty foods, maintain an exercise routine 3 to 5 days a week for a minimum total of 150 minutes.  Labs ordered.

## 2023-08-18 ENCOUNTER — Encounter (INDEPENDENT_AMBULATORY_CARE_PROVIDER_SITE_OTHER): Payer: Self-pay | Admitting: *Deleted

## 2023-09-03 ENCOUNTER — Encounter: Payer: No Typology Code available for payment source | Admitting: Adult Health

## 2023-09-09 ENCOUNTER — Other Ambulatory Visit: Payer: Self-pay | Admitting: Physician Assistant

## 2023-09-10 ENCOUNTER — Telehealth: Payer: Self-pay | Admitting: Physician Assistant

## 2023-09-10 ENCOUNTER — Other Ambulatory Visit: Payer: Self-pay | Admitting: Family Medicine

## 2023-09-10 DIAGNOSIS — R12 Heartburn: Secondary | ICD-10-CM

## 2023-09-10 NOTE — Telephone Encounter (Signed)
Next appt is 10/03/23. Victoria Mendez is requesting a refill on her Lorazepam as she is totally out. Call to:  [id number] - [place], [place] - [address]   Phone: [phone]  Fax: [phone]

## 2023-09-10 NOTE — Telephone Encounter (Signed)
Med rqst came in from pharmacy. Waiting for Rosey Bath to approve it.  Will notify pt

## 2023-09-10 NOTE — Telephone Encounter (Signed)
Lf 10/22

## 2023-09-10 NOTE — Telephone Encounter (Signed)
Called but ulvm

## 2023-09-17 ENCOUNTER — Telehealth (INDEPENDENT_AMBULATORY_CARE_PROVIDER_SITE_OTHER): Payer: Self-pay | Admitting: Otolaryngology

## 2023-09-17 NOTE — Telephone Encounter (Signed)
Scheduled appointment with Victoria Mendez yesterday, 09/16/2023 with patient and [employer] rep.  When I called to confirm appointment today ([id number])  patient canceled appointment stating husband's work schedule and stated she would call back to reschedule.

## 2023-09-18 ENCOUNTER — Institutional Professional Consult (permissible substitution) (INDEPENDENT_AMBULATORY_CARE_PROVIDER_SITE_OTHER): Payer: No Typology Code available for payment source

## 2023-10-03 ENCOUNTER — Encounter: Payer: Self-pay | Admitting: Physician Assistant

## 2023-10-03 ENCOUNTER — Ambulatory Visit: Payer: No Typology Code available for payment source | Admitting: Physician Assistant

## 2023-10-03 DIAGNOSIS — G4700 Insomnia, unspecified: Secondary | ICD-10-CM | POA: Diagnosis not present

## 2023-10-03 DIAGNOSIS — F411 Generalized anxiety disorder: Secondary | ICD-10-CM | POA: Diagnosis not present

## 2023-10-03 DIAGNOSIS — F4010 Social phobia, unspecified: Secondary | ICD-10-CM | POA: Diagnosis not present

## 2023-10-03 DIAGNOSIS — F329 Major depressive disorder, single episode, unspecified: Secondary | ICD-10-CM | POA: Diagnosis not present

## 2023-10-03 DIAGNOSIS — Z87891 Personal history of nicotine dependence: Secondary | ICD-10-CM

## 2023-10-03 MED ORDER — AUVELITY 45-105 MG PO TBCR: 1.0000 | EXTENDED_RELEASE_TABLET | Freq: Two times a day (BID) | ORAL | 1 refills | Status: DC

## 2023-10-03 MED ORDER — LORAZEPAM 1 MG PO TABS: 1.0000 mg | ORAL_TABLET | Freq: Two times a day (BID) | ORAL | 5 refills | Status: DC

## 2023-10-03 NOTE — Progress Notes (Signed)
 [facility]  Patient ID: Victoria Mendez,  MRN: [id number]  PCP: [name], FNP  Date of Evaluation: 10/03/2023 Time spent:25 minutes  Chief Complaint:  Virtual Visit via Telehealth  I connected with patient by telephone, with their informed consent, and verified patient privacy and that I am speaking with the correct person using two identifiers.  I am private, in my office and the patient is at home.  I discussed the limitations, risks, security and privacy concerns of performing an evaluation and management service by telephone and the availability of in person appointments. I also discussed with the patient that there may be a patient responsible charge related to this service. The patient expressed understanding and agreed to proceed.   I discussed the assessment and treatment plan with the patient. The patient was provided an opportunity to ask questions and all were answered. The patient agreed with the plan and demonstrated an understanding of the instructions.   The patient was advised to call back or seek an in-person evaluation if the symptoms worsen or if the condition fails to improve as anticipated.  I provided 25 minutes of non-face-to-face time during this encounter.  HISTORY/CURRENT STATUS: HPI for routine med check.  Doing well. The Auvelity is still effective. She's able to get up and not stay in bed all the time like before she went it. Patient is able to enjoy things.  Energy and motivation are good.  Work is going well.   No extreme sadness, tearfulness, or feelings of hopelessness.  Sleeps well most of the time. ADLs and personal hygiene are normal.   Denies any changes in concentration, making decisions, or remembering things.  Appetite has not changed.  Weight is stable. Takes the Ativan for times she's overwhelmed.  Not having PA.  Denies suicidal or homicidal thoughts.  Patient denies increased energy with decreased need for sleep,  increased talkativeness, racing thoughts, impulsivity or risky behaviors, increased spending, increased libido, grandiosity, increased irritability or anger, paranoia, or hallucinations.  Denies dizziness, syncope, seizures, numbness, tingling, tremor, tics, unsteady gait, slurred speech, confusion. Denies muscle or joint pain, stiffness, or dystonia.  Individual Medical History/ Review of Systems: Changes? :No    Past medications for mental health diagnoses include: Prozac, Pristiq, Ativan, Buspar, Wellbutrin (over 150 mg caused her to cry more)  Hydroxyzine, Zoloft  Allergies: Penicillins  Current Medications:  Current Outpatient Medications:    Ascorbic Acid (VITAMIN C) 100 MG tablet, Take 100 mg by mouth daily., Disp: , Rfl:    aspirin 81 MG chewable tablet, Chew by mouth daily., Disp: , Rfl:    cholecalciferol (VITAMIN D3) 25 MCG (1000 UNIT) tablet, Take 1,000 Units by mouth daily., Disp: , Rfl:    pantoprazole (PROTONIX) 40 MG tablet, TAKE 1 TABLET BY MOUTH EVERY DAY, Disp: 90 tablet, Rfl: 0   rosuvastatin (CRESTOR) 40 MG tablet, Take 1 tablet (40 mg total) by mouth daily., Disp: 90 tablet, Rfl: 3   sertraline (ZOLOFT) 100 MG tablet, TAKE 2 TABLETS BY MOUTH EVERY DAY, Disp: 180 tablet, Rfl: 3   Dextromethorphan-buPROPion ER (AUVELITY) 45-105 MG TBCR, Take 1 tablet by mouth 2 (two) times daily., Disp: 180 tablet, Rfl: 1   diazepam (VALIUM) 5 MG tablet, 1 po approx 2 hours prior to dental work. May repeat in 1 hour if anxiety not controlled. After procedure, take 1 p.o. every 6 hours as needed anxiety. Do not mix with Ativan. (Patient not taking: Reported on 10/03/2023), Disp: 4 tablet, Rfl:  0   esomeprazole (NEXIUM) 40 MG capsule, Take 40 mg by mouth daily at 12 noon. (Patient not taking: Reported on 10/03/2023), Disp: , Rfl:    LORazepam (ATIVAN) 1 MG tablet, Take 1 tablet (1 mg total) by mouth 2 (two) times daily., Disp: 30 tablet, Rfl: 5   methocarbamol (ROBAXIN) 500 MG tablet, Take 1  tablet (500 mg total) by mouth 3 (three) times daily as needed for muscle spasms. (Patient not taking: Reported on 10/03/2023), Disp: 30 tablet, Rfl: 1 Medication Side Effects: none  Family Medical/ Social History: Changes? no  MENTAL HEALTH EXAM:  There were no vitals taken for this visit.There is no height or weight on file to calculate BMI.  General Appearance:  Unable to assess  Eye Contact:   Unable to assess  Speech:  Clear and Coherent and Normal Rate  Volume:  Normal  Mood:  Euthymic  Affect:   Unable to assess  Thought Process:  Goal Directed and Descriptions of Associations: Circumstantial  Orientation:  Full (Time, Place, and Person)  Thought Content: Logical   Suicidal Thoughts:  No  Homicidal Thoughts:  No  Memory:  WNL  Judgement:  Good  Insight:  Good  Psychomotor Activity:   Unable to assess  Concentration:  Concentration: Good and Attention Span: Good  Recall:  Good  Fund of Knowledge: Good  Language: Good  Assets:  Desire for Improvement Financial Resources/Insurance Housing Resilience Transportation Vocational/Educational  ADL's:  Intact  Cognition: WNL  Prognosis:  Good   ECT-MADRS    Flowsheet Row Video Visit from 08/09/2022 in [facility]  MADRS Total Score 45      GAD-7    Flowsheet Row Video Visit from 08/14/2023 in [facility] Office Visit from 05/12/2023 in [facility] Office Visit from 02/10/2023 in [facility] Office Visit from 01/01/2023 in [facility] Office Visit from 11/14/2022 in [facility]  Total GAD-7 Score 0 0 4 7 2       PHQ2-9    Flowsheet Row Video Visit from 08/14/2023 in [facility] Office Visit from 05/12/2023 in [facility] Office Visit from 02/10/2023 in [facility] Office Visit from 01/01/2023 in [facility] Office Visit from 11/14/2022 in  [facility]  PHQ-2 Total Score 0 1 0 2 0  PHQ-9 Total Score 0 6 0 3 4      DIAGNOSES:    ICD-10-CM   1. Treatment-resistant depression  F32.9     2. Generalized anxiety disorder  F41.1     3. Social anxiety disorder  F40.10     4. Insomnia, unspecified type  G47.00     5. Former smoker  Z87.891       Receiving Psychotherapy: No   RECOMMENDATIONS:  PDMP was reviewed. Ativan filled 09/11/2023.  4 Valium given 06/25/2023.  Gabapentin filled 01/01/2023. I provided 25  minutes of non-face-to-face time during this encounter, including time spent before and after the visit in records review, medical decision making, counseling pertinent to today's visit, and charting.   She's still doing well on current meds so no changes need to be made.   Continue Auvelity 45-105 mg, 1 p.o. twice daily. Continue Ativan 1 mg, 1 po qhs. Continue Zoloft 100 mg 2 pills daily.. Return in 6 months.  Melony Overly, PA-C

## 2023-10-08 ENCOUNTER — Telehealth: Payer: Self-pay

## 2023-10-08 NOTE — Telephone Encounter (Signed)
 Prior authorization submitted for Auvelity 45-105 mg

## 2023-10-08 NOTE — Progress Notes (Unsigned)
 Virtual Visit via Video Note  I connected with Victoria Mendez on 10/10/23 at  3:00 PM EST by a video enabled telemedicine application and verified that I am speaking with the correct person using two identifiers.  Patient Location: Home Provider Location: Office/Clinic  I discussed the limitations, risks, security, and privacy concerns of performing an evaluation and management service by video and the availability of in person appointments. I also discussed with the patient that there may be a patient responsible charge related to this service. The patient expressed understanding and agreed to proceed.  Subjective: PCP: [name], FNP  Chief Complaint  Patient presents with   Acute Visit     Acid reflux medicine not working pain and burning from stomach to throat.  Request mouth wash for thrush x 2 months    Patient complains of frequent heartburn, which has been gradually worsening. She denies abdominal pain, coughing, hoarseness, or sore throat. The heartburn lasts approximately one hour and is located in the substernal region, with moderate intensity. It disrupts her sleep and limits her activity but does not change with position. There is no clear trigger, as it occurs spontaneously. She reports associated fatigue but denies melena. Notable risk factors include caffeine consumption (two cups of coffee per day). She has previously tried Tums and Protonix 40 mg once daily, both of which provided no relief.     ROS: Per HPI  Current Outpatient Medications:    Ascorbic Acid (VITAMIN C) 100 MG tablet, Take 100 mg by mouth daily., Disp: , Rfl:    aspirin 81 MG chewable tablet, Chew by mouth daily., Disp: , Rfl:    cholecalciferol (VITAMIN D3) 25 MCG (1000 UNIT) tablet, Take 1,000 Units by mouth daily., Disp: , Rfl:    Dextromethorphan-buPROPion ER (AUVELITY) 45-105 MG TBCR, Take 1 tablet by mouth 2 (two) times daily., Disp: 180 tablet, Rfl: 1   LORazepam (ATIVAN) 1  MG tablet, Take 1 tablet (1 mg total) by mouth 2 (two) times daily., Disp: 60 tablet, Rfl: 5   nystatin (MYCOSTATIN) 100000 UNIT/ML suspension, Take 5 mLs (500,000 Units total) by mouth 4 (four) times daily., Disp: 473 mL, Rfl: 0   pantoprazole (PROTONIX) 40 MG tablet, TAKE 1 TABLET BY MOUTH EVERY DAY, Disp: 90 tablet, Rfl: 0   rosuvastatin (CRESTOR) 40 MG tablet, Take 1 tablet (40 mg total) by mouth daily., Disp: 90 tablet, Rfl: 3   sucralfate (CARAFATE) 1 g tablet, Take 1 tablet (1 g total) by mouth 2 (two) times daily with a meal., Disp: 60 tablet, Rfl: 2   diazepam (VALIUM) 5 MG tablet, 1 po approx 2 hours prior to dental work. May repeat in 1 hour if anxiety not controlled. After procedure, take 1 p.o. every 6 hours as needed anxiety. Do not mix with Ativan. (Patient not taking: Reported on 10/10/2023), Disp: 4 tablet, Rfl: 0   methocarbamol (ROBAXIN) 500 MG tablet, Take 1 tablet (500 mg total) by mouth 3 (three) times daily as needed for muscle spasms. (Patient not taking: Reported on 10/10/2023), Disp: 30 tablet, Rfl: 1   sertraline (ZOLOFT) 100 MG tablet, TAKE 2 TABLETS BY MOUTH EVERY DAY (Patient not taking: Reported on 10/10/2023), Disp: 180 tablet, Rfl: 3  Observations/Objective: Today's Vitals   10/10/23 1352  Weight: 182 lb (82.6 kg)  Height: 5\' 8"  (1.727 m)  PainSc: 0-No pain   Physical Exam Patient is alert and no acute distress noted.   Assessment and Plan: Heartburn Assessment & Plan:  Trial Carafate 1 mg twice daily with meals Continue Protonix 40 mg once daily Limit caffeine, avoid large meals, and late-night eating while maintaining an upright posture after meals. Drinking plenty of water, incorporating smaller, more frequent meals, and reducing acidic or spicy foods may help alleviate symptoms. If symptoms persist, lifestyle modifications such as weight management, stress reduction, and elevating the head of the bed can provide additional relief. Referral placed to GI    Orders: -     Ambulatory referral to Gastroenterology -     Sucralfate; Take 1 tablet (1 g total) by mouth 2 (two) times daily with a meal.  Dispense: 60 tablet; Refill: 2  Oral thrush Assessment & Plan: Nystain suspension 4 times daily Explained patient brush teeth with a soft toothbrush, Use salt water mixture to rinse the mouth, Discussed probiotic fodds such as yogurt contain good bacteria that keep thrush in check. Limit sugar promotes yeast growth, foods contain mold such as breads, aged cheese and alcohol.     Other orders -     Nystatin; Take 5 mLs (500,000 Units total) by mouth 4 (four) times daily.  Dispense: 473 mL; Refill: 0    Follow Up Instructions: No follow-ups on file.   I discussed the assessment and treatment plan with the patient. The patient was provided an opportunity to ask questions, and all were answered. The patient agreed with the plan and demonstrated an understanding of the instructions.   The patient was advised to call back or seek an in-person evaluation if the symptoms worsen or if the condition fails to improve as anticipated.  The above assessment and management plan was discussed with the patient. The patient verbalized understanding of and has agreed to the management plan.   Victoria Lederer Newman Nip, FNP

## 2023-10-09 ENCOUNTER — Other Ambulatory Visit: Payer: Self-pay

## 2023-10-09 MED ORDER — LORAZEPAM 1 MG PO TABS: 1.0000 mg | ORAL_TABLET | Freq: Two times a day (BID) | ORAL | 5 refills | Status: DC

## 2023-10-09 NOTE — Telephone Encounter (Signed)
 Nedra called, she is helping her sick mom in [place], [place]. She knows her Penne Lash is needing a prior authorization and is waiting to see if approved. Jayna has 2 Lorazepam left for her panic attacks. Please call her Lorazepam to:  [facility], [place], [place] [address]. Phone number is [phone].  She wont be back to [place] until 10/18/23.

## 2023-10-09 NOTE — Telephone Encounter (Signed)
 PENDED LORAZEPAM 1MG  TO [facility].

## 2023-10-10 ENCOUNTER — Encounter: Payer: Self-pay | Admitting: Family Medicine

## 2023-10-10 ENCOUNTER — Telehealth: Payer: No Typology Code available for payment source | Admitting: Family Medicine

## 2023-10-10 VITALS — Ht 68.0 in | Wt 182.0 lb

## 2023-10-10 DIAGNOSIS — B370 Candidal stomatitis: Secondary | ICD-10-CM | POA: Diagnosis not present

## 2023-10-10 DIAGNOSIS — R12 Heartburn: Secondary | ICD-10-CM

## 2023-10-10 MED ORDER — SUCRALFATE 1 G PO TABS
1.0000 g | ORAL_TABLET | Freq: Two times a day (BID) | ORAL | 2 refills | Status: AC
Start: 2023-10-10 — End: ?

## 2023-10-10 MED ORDER — NYSTATIN 100000 UNIT/ML MT SUSP
5.0000 mL | Freq: Four times a day (QID) | OROMUCOSAL | 0 refills | Status: AC
Start: 2023-10-10 — End: ?

## 2023-10-10 NOTE — Assessment & Plan Note (Signed)
 Nystain suspension 4 times daily Explained patient brush teeth with a soft toothbrush, Use salt water mixture to rinse the mouth, Discussed probiotic fodds such as yogurt contain good bacteria that keep thrush in check. Limit sugar promotes yeast growth, foods contain mold such as breads, aged cheese and alcohol.

## 2023-10-10 NOTE — Assessment & Plan Note (Signed)
 Trial Carafate 1 mg twice daily with meals Continue Protonix 40 mg once daily Limit caffeine, avoid large meals, and late-night eating while maintaining an upright posture after meals. Drinking plenty of water, incorporating smaller, more frequent meals, and reducing acidic or spicy foods may help alleviate symptoms. If symptoms persist, lifestyle modifications such as weight management, stress reduction, and elevating the head of the bed can provide additional relief. Referral placed to GI

## 2023-10-14 ENCOUNTER — Ambulatory Visit: Payer: Self-pay | Admitting: Family Medicine

## 2023-10-14 DIAGNOSIS — H53121 Transient visual loss, right eye: Secondary | ICD-10-CM | POA: Insufficient documentation

## 2023-10-14 DIAGNOSIS — G453 Amaurosis fugax: Secondary | ICD-10-CM | POA: Insufficient documentation

## 2023-10-14 NOTE — Telephone Encounter (Signed)
 Copied from CRM [id number]. Topic: Clinical - Red Word Triage >> Oct 14, 2023 11:21 AM Eunice Blase wrote: Red Word that prompted transfer to Nurse Triage: Pt called lost vision in right eye today. Previously lost vision in both eyes, ER stated then maybe mini stroke.  Chief Complaint: Sudden loss of vision Symptoms: Dizziness, trouble recalling words Frequency: Today Pertinent Negatives: Patient denies headache and weakness Disposition: [x] ED /[] Urgent Care (no appt availability in office) / [] Appointment(In office/virtual)/ []  [facility]/ [] Home Care/ [] Refused Recommended Disposition /[] [facility]/ []  Follow-up with PCP Additional Notes: Patient called in to report sudden loss of vision in her right eye that occurred this morning. Patient stated that she lost vision for 5 minutes. Patient stated the left eye was unaffected. Patient went to an eye doctor following the event. The eye doctor did imaging and determined nothing was wrong with her eye alone. Patient stated a similar episode happened within the year and she was treated in the ED for a mini stroke. Patient stated she has been experiencing dizziness and is having difficulty talking/recalling words. This RN advised patient to go to the ED, per protocol. Patient complied. This RN advised patient to call back if anything changes.   Reason for Disposition  Complete loss of vision in one or both eyes  Answer Assessment - Initial Assessment Questions 1. DESCRIPTION: "How has your vision changed?" (e.g., complete vision loss, blurred vision, double vision, floaters, etc.)     Almost complete vision loss in right eye, left eye unaffected  2. LOCATION: "One or both eyes?" If one, ask: "Which eye?"     Right eye 3. SEVERITY: "Can you see anything?" If Yes, ask: "What can you see?" (e.g., fine print)     States she is able to see clearly out of right eye at this time 4. ONSET: "When did this begin?" "Did it start suddenly or  has this been gradual?"     This morning, lost vision for 5 minutes total 5. PATTERN: "Does this come and go, or has it been constant since it started?"     States she is able to see clearly out of right eye at this time 6. PAIN: "Is there any pain in your eye(s)?"  (Scale 1-10; or mild, moderate, severe)   - NONE (0): No pain.   - MILD (1-3): Doesn't interfere with normal activities.   - MODERATE (4-7): Interferes with normal activities or awakens from sleep.    - SEVERE (8-10): Excruciating pain, unable to do any normal activities.     Denies pain 8. CAUSE: "What do you think is causing this visual problem?"     Potentially another stroke 9. OTHER SYMPTOMS: "Do you have any other symptoms?" (e.g., confusion, headache, arm or leg weakness, speech problems)     Dizziness after episode, states she had to think harder about what she was saying after the episode at the eye doctor, denies headache, denies arm/leg weakness, denies facial changes  Protocols used: Vision Loss or Change-A-AH

## 2023-10-14 NOTE — Telephone Encounter (Signed)
 ER asap

## 2023-10-15 NOTE — Telephone Encounter (Signed)
Patient has been admitted to hospital.

## 2023-10-16 ENCOUNTER — Encounter: Payer: Self-pay | Admitting: Family Medicine

## 2023-10-16 ENCOUNTER — Encounter (INDEPENDENT_AMBULATORY_CARE_PROVIDER_SITE_OTHER): Payer: Self-pay | Admitting: *Deleted

## 2023-10-22 ENCOUNTER — Other Ambulatory Visit: Payer: Self-pay | Admitting: Family Medicine

## 2023-10-22 DIAGNOSIS — G453 Amaurosis fugax: Secondary | ICD-10-CM

## 2023-10-22 NOTE — Telephone Encounter (Signed)
Referral placed to Cardiology.

## 2023-10-29 ENCOUNTER — Encounter: Payer: Self-pay | Admitting: Family Medicine

## 2023-10-29 ENCOUNTER — Ambulatory Visit (INDEPENDENT_AMBULATORY_CARE_PROVIDER_SITE_OTHER): Payer: No Typology Code available for payment source | Admitting: Family Medicine

## 2023-10-29 VITALS — BP 126/78 | HR 89 | Ht 68.0 in | Wt 183.1 lb

## 2023-10-29 DIAGNOSIS — R5383 Other fatigue: Secondary | ICD-10-CM

## 2023-10-29 DIAGNOSIS — E538 Deficiency of other specified B group vitamins: Secondary | ICD-10-CM | POA: Diagnosis not present

## 2023-10-29 DIAGNOSIS — D509 Iron deficiency anemia, unspecified: Secondary | ICD-10-CM | POA: Diagnosis not present

## 2023-10-29 DIAGNOSIS — E559 Vitamin D deficiency, unspecified: Secondary | ICD-10-CM

## 2023-10-29 DIAGNOSIS — Z09 Encounter for follow-up examination after completed treatment for conditions other than malignant neoplasm: Secondary | ICD-10-CM

## 2023-10-29 DIAGNOSIS — E782 Mixed hyperlipidemia: Secondary | ICD-10-CM

## 2023-10-29 DIAGNOSIS — L219 Seborrheic dermatitis, unspecified: Secondary | ICD-10-CM

## 2023-10-29 DIAGNOSIS — Z136 Encounter for screening for cardiovascular disorders: Secondary | ICD-10-CM

## 2023-10-29 DIAGNOSIS — E038 Other specified hypothyroidism: Secondary | ICD-10-CM | POA: Diagnosis not present

## 2023-10-29 MED ORDER — KETOCONAZOLE 2 % EX SHAM: 1.0000 | MEDICATED_SHAMPOO | CUTANEOUS | 0 refills | Status: DC

## 2023-10-29 NOTE — Assessment & Plan Note (Addendum)
 Areas of erythema and scaling on the scalp, consistent with seborrheic dermatitis. There is noticeable flaking, with fine, greasy scales in the affected areas. No signs of infection or hair loss were observed. The scalp appears mildly inflamed but without open lesions  Trial on ketoconazole shampoo and apply a moisturizing conditioner. Avoid harsh hair products and limit hot water exposure. If symptoms persist follow up

## 2023-10-29 NOTE — Patient Instructions (Addendum)

## 2023-10-29 NOTE — Progress Notes (Addendum)
 Established Patient Office Visit   Subjective  Patient ID: Victoria Mendez, female    DOB: Jan 04, 1976  Age: 48 y.o. MRN: [id number]  Chief Complaint  Patient presents with   Hospitalization Follow-up    Hospital discharge 10/14/23 Lost sight in rt eye for 5 min Has experienced exhaustion and rash in scalp x 2.5 wks     She  has a past medical history of Agoraphobia, Allergy, and Depression.  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 For the details of today's visit, please refer to assessment and plan.     Review of Systems  Constitutional:  Negative for chills and fever.  Eyes:  Negative for blurred vision and pain.  Respiratory:  Negative for shortness of breath.   Cardiovascular:  Negative for chest pain.  Skin:  Positive for itching and rash.       scalp  Neurological:  Negative for dizziness and headaches.      Objective:     BP 126/78   Pulse 89   Ht 5\' 8"  (1.727 m)   Wt 183 lb 1.9 oz (83.1 kg)   SpO2 98%   BMI 27.84 kg/m  BP Readings from Last 3 Encounters:  10/29/23 126/78  05/12/23 98/74  05/12/23 106/73      Physical Exam Vitals reviewed.  Constitutional:      General: She is not in acute distress.    Appearance: Normal appearance. She is not ill-appearing, toxic-appearing or diaphoretic.  HENT:     Head: Normocephalic.  Eyes:     General:        Right eye: No discharge.        Left eye: No discharge.     Conjunctiva/sclera: Conjunctivae normal.     Pupils: Pupils are equal, round, and reactive to light.  Cardiovascular:     Rate and Rhythm: Normal rate.     Pulses: Normal pulses.     Heart sounds: Normal heart sounds.  Pulmonary:     Effort: Pulmonary effort is normal. No respiratory distress.     Breath sounds: Normal breath sounds.  Skin:    General: Skin is warm and dry.     Capillary Refill: Capillary refill takes less than 2 seconds.  Neurological:      General: No focal deficit present.     Mental Status: She is alert and oriented to person, place, and time.     Coordination: Coordination normal.     Gait: Gait normal.  Psychiatric:        Mood and Affect: Mood normal.        Behavior: Behavior normal.      No results found for any visits on 10/29/23.  The 10-year ASCVD risk score (Arnett DK, et al., 2019) is: 0.6%    Assessment & Plan:  Fatigue, unspecified type -     CBC with Differential/Platelet  Iron deficiency anemia, unspecified iron deficiency anemia type -     Iron, TIBC and Ferritin Panel  Vitamin B12 deficiency -     Vitamin B12  TSH (thyroid-stimulating hormone deficiency) -     TSH + free T4  Vitamin D deficiency -     VITAMIN D 25 Hydroxy (Vit-D Deficiency, Fractures)  Mixed hyperlipidemia -     Lipid panel  Seborrheic dermatitis Assessment & Plan: Areas of erythema and scaling on the scalp, consistent with seborrheic dermatitis. There is noticeable flaking, with fine,  greasy scales in the affected areas. No signs of infection or hair loss were observed. The scalp appears mildly inflamed but without open lesions  Trial on ketoconazole shampoo and apply a moisturizing conditioner. Avoid harsh hair products and limit hot water exposure. If symptoms persist follow up   Orders: -     Ketoconazole; Apply 1 Application topically 2 (two) times a week. For 2-4 weeks  Dispense: 120 mL; Refill: 0  Encounter for screening for cardiovascular disorders [facility] discharge follow-up Assessment & Plan: BMP and CBC labs ordered. The hospital chart, including the discharge summary, was thoroughly reviewed Medications were thoroughly reviewed and reconciled with the patient.  CTA neck No extracranial carotid or vertebral stenosis/occlusion No dissection Arch unremarkable 2. CTA brain No large vessel occlusion or significant stenosis. No aneurysm. 3. Noncontrast CT brain No mass, acute infarct, or  hemorrhage.  Referral placed to cardiology      Return in 6 months (on 04/30/2024), or if symptoms worsen or fail to improve, for hyperlipidemia.   [name], FNP

## 2023-10-29 NOTE — Assessment & Plan Note (Addendum)
 BMP and CBC labs ordered. The hospital chart, including the discharge summary, was thoroughly reviewed Medications were thoroughly reviewed and reconciled with the patient.  CTA neck No extracranial carotid or vertebral stenosis/occlusion No dissection Arch unremarkable 2. CTA brain No large vessel occlusion or significant stenosis. No aneurysm. 3. Noncontrast CT brain No mass, acute infarct, or hemorrhage.  Referral placed to cardiology

## 2023-10-30 ENCOUNTER — Telehealth: Payer: No Typology Code available for payment source | Admitting: Family Medicine

## 2023-11-05 ENCOUNTER — Encounter: Payer: Self-pay | Admitting: Family Medicine

## 2023-11-05 LAB — CBC WITH DIFFERENTIAL/PLATELET
Basophils Absolute: 0.1 10*3/uL (ref 0.0–0.2)
Basos: 1 %
EOS (ABSOLUTE): 0.2 10*3/uL (ref 0.0–0.4)
Eos: 2 %
Hematocrit: 40.5 % (ref 34.0–46.6)
Hemoglobin: 13.4 g/dL (ref 11.1–15.9)
Immature Grans (Abs): 0 10*3/uL (ref 0.0–0.1)
Immature Granulocytes: 0 %
Lymphocytes Absolute: 2.6 10*3/uL (ref 0.7–3.1)
Lymphs: 31 %
MCH: 29.4 pg (ref 26.6–33.0)
MCHC: 33.1 g/dL (ref 31.5–35.7)
MCV: 89 fL (ref 79–97)
Monocytes Absolute: 0.7 10*3/uL (ref 0.1–0.9)
Monocytes: 9 %
Neutrophils Absolute: 4.8 10*3/uL (ref 1.4–7.0)
Neutrophils: 57 %
Platelets: 252 10*3/uL (ref 150–450)
RBC: 4.56 x10E6/uL (ref 3.77–5.28)
RDW: 12.7 % (ref 11.7–15.4)
WBC: 8.4 10*3/uL (ref 3.4–10.8)

## 2023-11-05 LAB — LIPID PANEL
Chol/HDL Ratio: 1.8 ratio (ref 0.0–4.4)
Cholesterol, Total: 116 mg/dL (ref 100–199)
HDL: 66 mg/dL (ref >39)
LDL Chol Calc (NIH): 36 mg/dL (ref 0–99)
Triglycerides: 63 mg/dL (ref 0–149)
VLDL Cholesterol Cal: 14 mg/dL (ref 5–40)

## 2023-11-05 LAB — IRON,TIBC AND FERRITIN PANEL
Ferritin: 62 ng/mL (ref 15–150)
Iron Saturation: 20 % (ref 15–55)
Iron: 59 ug/dL (ref 27–159)
Total Iron Binding Capacity: 296 ug/dL (ref 250–450)
UIBC: 237 ug/dL (ref 131–425)

## 2023-11-05 LAB — VITAMIN B12: Vitamin B-12: 582 pg/mL (ref 232–1245)

## 2023-11-05 LAB — VITAMIN D 25 HYDROXY (VIT D DEFICIENCY, FRACTURES): Vit D, 25-Hydroxy: 55.9 ng/mL (ref 30.0–100.0)

## 2023-11-05 LAB — TSH+FREE T4
Free T4: 1.29 ng/dL (ref 0.82–1.77)
TSH: 1.24 u[IU]/mL (ref 0.450–4.500)

## 2023-11-19 ENCOUNTER — Other Ambulatory Visit: Payer: Self-pay | Admitting: Family Medicine

## 2023-11-19 MED ORDER — FLUCONAZOLE 100 MG PO TABS: 100.0000 mg | ORAL_TABLET | Freq: Every day | ORAL | 0 refills | Status: AC

## 2023-11-19 MED ORDER — FLUCONAZOLE 200 MG PO TABS: 200.0000 mg | ORAL_TABLET | Freq: Every day | ORAL | 0 refills | Status: AC

## 2023-11-20 ENCOUNTER — Ambulatory Visit: Payer: No Typology Code available for payment source | Admitting: Adult Health

## 2023-11-25 ENCOUNTER — Other Ambulatory Visit: Payer: Self-pay | Admitting: Family Medicine

## 2023-11-25 DIAGNOSIS — L219 Seborrheic dermatitis, unspecified: Secondary | ICD-10-CM

## 2023-11-25 NOTE — Telephone Encounter (Signed)
 LVM TO CALL AND SCHEDULE APPT

## 2023-11-25 NOTE — Telephone Encounter (Signed)
 Please schedule office visit to discuss symptoms

## 2023-12-04 ENCOUNTER — Ambulatory Visit: Payer: No Typology Code available for payment source | Admitting: Family Medicine

## 2023-12-06 ENCOUNTER — Other Ambulatory Visit: Payer: Self-pay | Admitting: Family Medicine

## 2023-12-06 DIAGNOSIS — R12 Heartburn: Secondary | ICD-10-CM

## 2023-12-09 ENCOUNTER — Ambulatory Visit: Admitting: Internal Medicine

## 2023-12-09 ENCOUNTER — Ambulatory Visit (INDEPENDENT_AMBULATORY_CARE_PROVIDER_SITE_OTHER): Payer: No Typology Code available for payment source | Admitting: Gastroenterology

## 2023-12-16 ENCOUNTER — Other Ambulatory Visit: Payer: Self-pay | Admitting: Family Medicine

## 2023-12-16 ENCOUNTER — Telehealth: Admitting: Family Medicine

## 2023-12-16 DIAGNOSIS — B3731 Acute candidiasis of vulva and vagina: Secondary | ICD-10-CM

## 2023-12-16 DIAGNOSIS — E785 Hyperlipidemia, unspecified: Secondary | ICD-10-CM

## 2023-12-16 DIAGNOSIS — R12 Heartburn: Secondary | ICD-10-CM

## 2023-12-16 MED ORDER — FLUCONAZOLE 150 MG PO TABS: 150.0000 mg | ORAL_TABLET | Freq: Every day | ORAL | 0 refills | Status: AC

## 2023-12-16 NOTE — Patient Instructions (Signed)

## 2023-12-16 NOTE — Progress Notes (Signed)
 Virtual Visit Consent   Victoria Mendez, you are scheduled for a virtual visit with a [facility] provider today. Just as with appointments in the office, your consent must be obtained to participate. Your consent will be active for this visit and any virtual visit you may have with one of our providers in the next 365 days. If you have a MyChart account, a copy of this consent can be sent to you electronically.  As this is a virtual visit, video technology does not allow for your provider to perform a traditional examination. This may limit your provider's ability to fully assess your condition. If your provider identifies any concerns that need to be evaluated in person or the need to arrange testing (such as labs, EKG, etc.), we will make arrangements to do so. Although advances in technology are sophisticated, we cannot ensure that it will always work on either your end or our end. If the connection with a video visit is poor, the visit may have to be switched to a telephone visit. With either a video or telephone visit, we are not always able to ensure that we have a secure connection.  By engaging in this virtual visit, you consent to the provision of healthcare and authorize for your insurance to be billed (if applicable) for the services provided during this visit. Depending on your insurance coverage, you may receive a charge related to this service.  I need to obtain your verbal consent now. Are you willing to proceed with your visit today? GRACE SELLES has provided verbal consent on 12/16/2023 for a virtual visit (video or telephone). Albertha Huger, FNP  Date: 12/16/2023 5:42 PM   Virtual Visit via Video Note   I, Albertha Huger, connected with  Victoria Mendez  ([id number], 10/30/1975) on 12/16/23 at  5:45 PM EDT by a video-enabled telemedicine application and verified that I am speaking with the correct person using two identifiers.  Location: Patient: Virtual Visit Location Patient:  Home Provider: Virtual Visit Location Provider: Home Office   I discussed the limitations of evaluation and management by telemedicine and the availability of in person appointments. The patient expressed understanding and agreed to proceed.    History of Present Illness: Victoria Mendez is a 48 y.o. who identifies as a female who was assigned female at birth, and is being seen today for yeast and fungal infection in scalp, in mouth, and in vagina. Her pcp gave her fluconazole  daily for a week and sx went away and then cam back. She has an apptmt with them in May but would like a refill due to an upcoming wedding she must attend. Aaron Aas  HPI: HPI  Problems:  Patient Active Problem List   Diagnosis Date Noted   Hospital discharge follow-up 10/29/2023   Seborrheic dermatitis 10/29/2023   Transient visual loss of right eye 10/14/2023   Amaurosis fugax of right eye 10/14/2023   Oral thrush 10/10/2023   Hot flashes, menopausal 05/12/2023   Snoring 05/12/2023   Hyperlipidemia 02/10/2023   Amaurosis fugax 02/10/2023   Heartburn 01/01/2023   Cervical sprain, initial encounter 11/14/2022   Hand discomfort 11/04/2022   Major depressive disorder, recurrent episode, moderate (HCC) 06/06/2018   PTSD (post-traumatic stress disorder) 06/06/2018   Social phobia 06/06/2018    Allergies:  Allergies  Allergen Reactions   Penicillins Hives   Medications:  Current Outpatient Medications:    Ascorbic Acid (VITAMIN C) 100 MG tablet, Take 100 mg by mouth daily. (Patient not  taking: Reported on 10/29/2023), Disp: , Rfl:    aspirin 81 MG chewable tablet, Chew by mouth daily., Disp: , Rfl:    aspirin EC 325 MG tablet, Take 1 tablet by mouth daily., Disp: , Rfl:    cholecalciferol (VITAMIN D3) 25 MCG (1000 UNIT) tablet, Take 1,000 Units by mouth daily. (Patient not taking: Reported on 10/29/2023), Disp: , Rfl:    Dextromethorphan-buPROPion  ER (AUVELITY ) 45-105 MG TBCR, Take 1 tablet by mouth 2 (two) times  daily., Disp: 180 tablet, Rfl: 1   diazepam  (VALIUM ) 5 MG tablet, 1 po approx 2 hours prior to dental work. May repeat in 1 hour if anxiety not controlled. After procedure, take 1 p.o. every 6 hours as needed anxiety. Do not mix with Ativan . (Patient not taking: Reported on 10/03/2023), Disp: 4 tablet, Rfl: 0   ketoconazole  (NIZORAL ) 2 % shampoo, APPLY 1 APPLICATION TOPICALLY 2 (TWO) TIMES A WEEK. FOR 2-4 WEEKS, Disp: 120 mL, Rfl: 0   LORazepam  (ATIVAN ) 1 MG tablet, Take 1 tablet (1 mg total) by mouth 2 (two) times daily., Disp: 60 tablet, Rfl: 5   methocarbamol  (ROBAXIN ) 500 MG tablet, Take 1 tablet (500 mg total) by mouth 3 (three) times daily as needed for muscle spasms. (Patient not taking: Reported on 10/03/2023), Disp: 30 tablet, Rfl: 1   Multiple Vitamins-Minerals (CENTRUM SILVER WOMEN 50+) TABS, Take 1 tablet by mouth daily., Disp: , Rfl:    nystatin  (MYCOSTATIN ) 100000 UNIT/ML suspension, Take 5 mLs (500,000 Units total) by mouth 4 (four) times daily., Disp: 473 mL, Rfl: 0   pantoprazole  (PROTONIX ) 40 MG tablet, TAKE 1 TABLET BY MOUTH EVERY DAY, Disp: 90 tablet, Rfl: 0   rosuvastatin  (CRESTOR ) 40 MG tablet, TAKE 1 TABLET BY MOUTH EVERY DAY, Disp: 90 tablet, Rfl: 3   sertraline  (ZOLOFT ) 100 MG tablet, TAKE 2 TABLETS BY MOUTH EVERY DAY (Patient not taking: Reported on 10/29/2023), Disp: 180 tablet, Rfl: 3   sucralfate  (CARAFATE ) 1 g tablet, Take 1 tablet (1 g total) by mouth 2 (two) times daily with a meal., Disp: 60 tablet, Rfl: 2   SUPER B COMPLEX/C PO, Take 1 tablet by mouth daily., Disp: , Rfl:   Observations/Objective: Patient is well-developed, well-nourished in no acute distress.  Resting comfortably  at home.  Head is normocephalic, atraumatic.  No labored breathing.  Speech is clear and coherent with logical content.  Patient is alert and oriented at baseline.    Assessment and Plan: 1. Candidiasis of vagina (Primary)  Follow up as scheduled with pcp- go to urgent care if sx  worsen.  Follow Up Instructions: I discussed the assessment and treatment plan with the patient. The patient was provided an opportunity to ask questions and all were answered. The patient agreed with the plan and demonstrated an understanding of the instructions.  A copy of instructions were sent to the patient via MyChart unless otherwise noted below.     The patient was advised to call back or seek an in-person evaluation if the symptoms worsen or if the condition fails to improve as anticipated.    Aela Bohan, FNP

## 2023-12-22 ENCOUNTER — Other Ambulatory Visit: Payer: Self-pay | Admitting: Family Medicine

## 2023-12-22 DIAGNOSIS — R12 Heartburn: Secondary | ICD-10-CM

## 2023-12-22 NOTE — Telephone Encounter (Signed)
    Copied from CRM [id number]. Topic: Clinical - Medication Refill >> Dec 22, 2023  4:50 PM Kevelyn M wrote: Most Recent Primary Care Visit:  Provider: Rosanna Comment  Department: [facility]  Visit Type: HOSPITAL FOLLOW UP  Date: 10/29/2023  Medication: pantoprazole  (PROTONIX ) 40 MG tablet  Has the patient contacted their pharmacy? Yes (Agent: If no, request that the patient contact the pharmacy for the refill. If patient does not wish to contact the pharmacy document the reason why and proceed with request.) (Agent: If yes, when and what did the pharmacy advise?)  Is this the correct pharmacy for this prescription? Yes If no, delete pharmacy and type the correct one.  This is the patient's preferred pharmacy:   [facility] Environmental education officer) [address] [place], [place]  [address]  [employer] rep was not able to provide fax number.   Has the prescription been filled recently? No  Is the patient out of the medication? Yes  Has the patient been seen for an appointment in the last year OR does the patient have an upcoming appointment? Yes  Can we respond through MyChart? Yes  Agent: Please be advised that Rx refills may take up to 3 business days. We ask that you follow-up with your pharmacy.

## 2023-12-26 ENCOUNTER — Encounter (HOSPITAL_COMMUNITY): Payer: Self-pay

## 2023-12-26 ENCOUNTER — Other Ambulatory Visit: Payer: Self-pay | Admitting: Medical Genetics

## 2024-01-02 ENCOUNTER — Encounter: Admitting: Family Medicine

## 2024-01-05 ENCOUNTER — Ambulatory Visit: Admitting: Adult Health

## 2024-03-09 ENCOUNTER — Other Ambulatory Visit: Payer: Self-pay | Admitting: Physician Assistant

## 2024-03-09 ENCOUNTER — Other Ambulatory Visit: Payer: Self-pay | Admitting: Family Medicine

## 2024-03-09 DIAGNOSIS — R12 Heartburn: Secondary | ICD-10-CM

## 2024-03-18 ENCOUNTER — Encounter: Admitting: Family Medicine

## 2024-04-30 ENCOUNTER — Ambulatory Visit: Admitting: Family Medicine

## 2024-05-01 ENCOUNTER — Other Ambulatory Visit: Payer: Self-pay | Admitting: Physician Assistant

## 2024-05-03 ENCOUNTER — Telehealth (INDEPENDENT_AMBULATORY_CARE_PROVIDER_SITE_OTHER): Admitting: Physician Assistant

## 2024-05-03 ENCOUNTER — Encounter: Payer: Self-pay | Admitting: Physician Assistant

## 2024-05-03 DIAGNOSIS — G4700 Insomnia, unspecified: Secondary | ICD-10-CM

## 2024-05-03 DIAGNOSIS — F4010 Social phobia, unspecified: Secondary | ICD-10-CM | POA: Diagnosis not present

## 2024-05-03 DIAGNOSIS — F329 Major depressive disorder, single episode, unspecified: Secondary | ICD-10-CM | POA: Diagnosis not present

## 2024-05-03 DIAGNOSIS — F411 Generalized anxiety disorder: Secondary | ICD-10-CM

## 2024-05-03 MED ORDER — AUVELITY 45-105 MG PO TBCR: 1.0000 | EXTENDED_RELEASE_TABLET | Freq: Two times a day (BID) | ORAL | 5 refills | Status: AC

## 2024-05-03 MED ORDER — SERTRALINE HCL 50 MG PO TABS: 50.0000 mg | ORAL_TABLET | Freq: Every day | ORAL | 0 refills | Status: DC

## 2024-05-03 MED ORDER — LORAZEPAM 1 MG PO TABS: 1.0000 mg | ORAL_TABLET | Freq: Two times a day (BID) | ORAL | 5 refills | Status: AC

## 2024-05-03 NOTE — Progress Notes (Signed)
 [facility]  Patient ID: Victoria Mendez,  MRN: [id number]  PCP: Terry Wilhelmena Lloyd Hilario, FNP  Date of Evaluation: 05/03/2024 Time spent:20 minutes  Chief Complaint:   Chief Complaint   Depression; Anxiety; Follow-up   Virtual Visit via Telehealth  I connected with patient by telephone, with their informed consent, and verified patient privacy and that I am speaking with the correct person using two identifiers.  I am private, in my office and the patient is at home.  I discussed the limitations, risks, security and privacy concerns of performing an evaluation and management service by telephone and the availability of in person appointments. I also discussed with the patient that there may be a patient responsible charge related to this service. The patient expressed understanding and agreed to proceed.   I discussed the assessment and treatment plan with the patient. The patient was provided an opportunity to ask questions and all were answered. The patient agreed with the plan and demonstrated an understanding of the instructions.   The patient was advised to call back or seek an in-person evaluation if the symptoms worsen or if the condition fails to improve as anticipated.  I provided 20 minutes of non-face-to-face time during this encounter.  HISTORY/CURRENT STATUS: HPI for routine med check.  She went off Zoloft  at the encouragement of her sister, thinking it was keeping her from losing weight.  Has been off it for maybe about 5 months.  She is eating healthy and trying to do everything right, but still is not losing weight.  She had no withdrawals.  Since being off the Zoloft , she had 1 severe panic attack.  Has not had 1 like that in years.  She is also overwhelmed a lot.  Not sure if it is related to being off the Zoloft  or not.  Christabel continues to respond well to the Auvelity .  She has been on it for several years now with wonderful results.  Energy and  motivation are good.  Work is going well.   No extreme sadness, tearfulness, or feelings of hopelessness.  Sleeps well, takes the lorazepam  every night which helps turn her mind off so she can go to sleep.  ADLs and personal hygiene are normal.   Denies any changes in concentration, making decisions, or remembering things.  Appetite has not changed.  No mania, delirium, AH/VH.  No SI/HI.  Individual Medical History/ Review of Systems: Changes? :No    Past medications for mental health diagnoses include: Prozac , Pristiq, Ativan , Buspar , Wellbutrin  (over 150 mg caused her to cry more)  Hydroxyzine , Zoloft   Allergies: Penicillins  Current Medications:  Current Outpatient Medications:    Multiple Vitamins-Minerals (CENTRUM SILVER WOMEN 50+) TABS, Take 1 tablet by mouth daily., Disp: , Rfl:    pantoprazole  (PROTONIX ) 40 MG tablet, TAKE 1 TABLET BY MOUTH EVERY DAY, Disp: 90 tablet, Rfl: 0   rosuvastatin  (CRESTOR ) 40 MG tablet, TAKE 1 TABLET BY MOUTH EVERY DAY, Disp: 90 tablet, Rfl: 3   sertraline  (ZOLOFT ) 50 MG tablet, Take 1 tablet (50 mg total) by mouth daily., Disp: 90 tablet, Rfl: 0   sucralfate  (CARAFATE ) 1 g tablet, Take 1 tablet (1 g total) by mouth 2 (two) times daily with a meal., Disp: 60 tablet, Rfl: 2   UNABLE TO FIND, ERT, Disp: , Rfl:    Ascorbic Acid (VITAMIN C) 100 MG tablet, Take 100 mg by mouth daily. (Patient not taking: Reported on 05/03/2024), Disp: , Rfl:    aspirin 81  MG chewable tablet, Chew by mouth daily. (Patient not taking: Reported on 05/03/2024), Disp: , Rfl:    aspirin EC 325 MG tablet, Take 1 tablet by mouth daily. (Patient not taking: Reported on 05/03/2024), Disp: , Rfl:    cholecalciferol (VITAMIN D3) 25 MCG (1000 UNIT) tablet, Take 1,000 Units by mouth daily. (Patient not taking: Reported on 05/03/2024), Disp: , Rfl:    Dextromethorphan-buPROPion  ER (AUVELITY ) 45-105 MG TBCR, Take 1 tablet by mouth 2 (two) times daily., Disp: 60 tablet, Rfl: 5   ketoconazole  (NIZORAL )  2 % shampoo, APPLY 1 APPLICATION TOPICALLY 2 (TWO) TIMES A WEEK. FOR 2-4 WEEKS, Disp: 120 mL, Rfl: 0   LORazepam  (ATIVAN ) 1 MG tablet, Take 1 tablet (1 mg total) by mouth 2 (two) times daily., Disp: 60 tablet, Rfl: 5   methocarbamol  (ROBAXIN ) 500 MG tablet, Take 1 tablet (500 mg total) by mouth 3 (three) times daily as needed for muscle spasms. (Patient not taking: Reported on 05/03/2024), Disp: 30 tablet, Rfl: 1   nystatin  (MYCOSTATIN ) 100000 UNIT/ML suspension, Take 5 mLs (500,000 Units total) by mouth 4 (four) times daily., Disp: 473 mL, Rfl: 0   SUPER B COMPLEX/C PO, Take 1 tablet by mouth daily. (Patient not taking: Reported on 05/03/2024), Disp: , Rfl:  Medication Side Effects: none  Family Medical/ Social History: Changes? no  MENTAL HEALTH EXAM:  There were no vitals taken for this visit.There is no height or weight on file to calculate BMI.  General Appearance: Casual and Well Groomed  Eye Contact:  Good  Speech:  Clear and Coherent and Normal Rate  Volume:  Normal  Mood:  Euthymic  Affect:  Congruent  Thought Process:  Goal Directed and Descriptions of Associations: Circumstantial  Orientation:  Full (Time, Place, and Person)  Thought Content: Logical   Suicidal Thoughts:  No  Homicidal Thoughts:  No  Memory:  WNL  Judgement:  Good  Insight:  Good  Psychomotor Activity:  Normal  Concentration:  Concentration: Good and Attention Span: Good  Recall:  Good  Fund of Knowledge: Good  Language: Good  Assets:  Desire for Improvement Financial Resources/Insurance Housing Resilience Transportation Vocational/Educational  ADL's:  Intact  Cognition: WNL  Prognosis:  Good   ECT-MADRS    Flowsheet Row Video Visit from 08/09/2022 in [facility]  MADRS Total Score 45   GAD-7    Flowsheet Row Video Visit from 08/14/2023 in [facility] Office Visit from 05/12/2023 in [facility] Office Visit from  02/10/2023 in [facility] Office Visit from 01/01/2023 in [facility] Office Visit from 11/14/2022 in [facility]  Total GAD-7 Score 0 0 4 7 2    PHQ2-9    Flowsheet Row Video Visit from 08/14/2023 in [facility] Office Visit from 05/12/2023 in [facility] Office Visit from 02/10/2023 in [facility] Office Visit from 01/01/2023 in [facility] Office Visit from 11/14/2022 in [facility]  PHQ-2 Total Score 0 1 0 2 0  PHQ-9 Total Score 0 6 0 3 4   DIAGNOSES:    ICD-10-CM   1. Treatment-resistant depression  F32.9     2. Generalized anxiety disorder  F41.1     3. Social anxiety disorder  F40.10     4. Insomnia, unspecified type  G47.00       Receiving Psychotherapy: No  RECOMMENDATIONS:  PDMP was reviewed. Ativan  filled 02/28/2024. I provided approximately 20 minutes of non-face-to-face time during this encounter, including time spent before and after the visit in records review, medical decision making, counseling pertinent to today's visit, and charting.   We discussed to the Zoloft .  Being off it has not affected her weight.  I think she is more anxious since being off, therefore I recommend restarting the Zoloft .  We can start at a low dose and only increase if needed.  Benefits, risk and side effects were discussed and she accepts.  Continue Auvelity  45-105 mg, 1 p.o. twice daily. Continue Ativan  1 mg, 1 po qhs. Restart Zoloft  50 mg, 1 p.o. daily. Return in 6 weeks.  Verneita Cooks, PA-C

## 2024-05-11 ENCOUNTER — Encounter: Payer: Self-pay | Admitting: Family Medicine

## 2024-05-18 ENCOUNTER — Other Ambulatory Visit: Payer: Self-pay | Admitting: Family Medicine

## 2024-05-18 DIAGNOSIS — Z1231 Encounter for screening mammogram for malignant neoplasm of breast: Secondary | ICD-10-CM

## 2024-06-05 ENCOUNTER — Other Ambulatory Visit: Payer: Self-pay | Admitting: Family Medicine

## 2024-06-05 DIAGNOSIS — R12 Heartburn: Secondary | ICD-10-CM

## 2024-06-07 ENCOUNTER — Other Ambulatory Visit: Payer: Self-pay | Admitting: Medical Genetics

## 2024-06-07 DIAGNOSIS — Z006 Encounter for examination for normal comparison and control in clinical research program: Secondary | ICD-10-CM

## 2024-06-15 ENCOUNTER — Telehealth (INDEPENDENT_AMBULATORY_CARE_PROVIDER_SITE_OTHER): Admitting: Physician Assistant

## 2024-06-15 ENCOUNTER — Encounter: Payer: Self-pay | Admitting: Physician Assistant

## 2024-06-15 DIAGNOSIS — F329 Major depressive disorder, single episode, unspecified: Secondary | ICD-10-CM

## 2024-06-15 DIAGNOSIS — Z87891 Personal history of nicotine dependence: Secondary | ICD-10-CM

## 2024-06-15 DIAGNOSIS — F411 Generalized anxiety disorder: Secondary | ICD-10-CM | POA: Diagnosis not present

## 2024-06-15 DIAGNOSIS — F4010 Social phobia, unspecified: Secondary | ICD-10-CM

## 2024-06-15 MED ORDER — SERTRALINE HCL 100 MG PO TABS
100.0000 mg | ORAL_TABLET | Freq: Every day | ORAL | 1 refills | Status: AC
Start: 2024-06-15 — End: ?

## 2024-06-15 NOTE — Progress Notes (Unsigned)
 [facility]  Patient ID: Victoria Mendez,  MRN: [id number]  PCP: Terry Wilhelmena Lloyd Hilario, FNP  Date of Evaluation: 06/15/2024 Time spent:20 minutes  Chief Complaint:   Chief Complaint   Anxiety; Depression; Follow-up   Virtual Visit via Telehealth  I connected with patient by a video enabled telemedicine application  with their informed consent, and verified patient privacy and that I am speaking with the correct person using two identifiers.  I am private, in my office and the patient is at home.  I discussed the limitations, risks, security and privacy concerns of performing an evaluation and management service by video and the availability of in person appointments. I also discussed with the patient that there may be a patient responsible charge related to this service. The patient expressed understanding and agreed to proceed.   I discussed the assessment and treatment plan with the patient. The patient was provided an opportunity to ask questions and all were answered. The patient agreed with the plan and demonstrated an understanding of the instructions.   The patient was advised to call back or seek an in-person evaluation if the symptoms worsen or if the condition fails to improve as anticipated.  I provided approximately 20  minutes of non-face-to-face time during this encounter.  HISTORY/CURRENT STATUS: HPI for routine med check.  Victoria Mendez is some better since restarting Zoloft  6 weeks ago.  She still has some depression though.  She lacks interest and motivation.  It is nothing like it used to be however.  She is able to work.  ADLs are normal.  Appetite is normal and weight is stable.  She is not crying easily.  No feelings of hopelessness.  Anxiety is controlled with Ativan .  No panic attacks but she gets overwhelmed if she does not take it.  Sleeps okay.  No suicidal or homicidal thoughts.  No reports of increased energy with decreased need for sleep,  increased talkativeness, racing thoughts, impulsivity or risky behaviors, increased spending, grandiosity, increased irritability or anger, paranoia, or hallucinations.  Individual Medical History/ Review of Systems: Changes? :No    Past medications for mental health diagnoses include: Prozac , Pristiq, Ativan , Buspar , Wellbutrin  (over 150 mg caused her to cry more)  Hydroxyzine , Zoloft   Allergies: Penicillins  Current Medications:  Current Outpatient Medications:    Dextromethorphan-buPROPion  ER (AUVELITY ) 45-105 MG TBCR, Take 1 tablet by mouth 2 (two) times daily., Disp: 60 tablet, Rfl: 5   LORazepam  (ATIVAN ) 1 MG tablet, Take 1 tablet (1 mg total) by mouth 2 (two) times daily., Disp: 60 tablet, Rfl: 5   pantoprazole  (PROTONIX ) 40 MG tablet, TAKE 1 TABLET BY MOUTH EVERY DAY, Disp: 90 tablet, Rfl: 0   rosuvastatin  (CRESTOR ) 40 MG tablet, TAKE 1 TABLET BY MOUTH EVERY DAY, Disp: 90 tablet, Rfl: 3   sertraline  (ZOLOFT ) 100 MG tablet, Take 1 tablet (100 mg total) by mouth daily., Disp: 90 tablet, Rfl: 1   sucralfate  (CARAFATE ) 1 g tablet, Take 1 tablet (1 g total) by mouth 2 (two) times daily with a meal., Disp: 60 tablet, Rfl: 2   Ascorbic Acid (VITAMIN C) 100 MG tablet, Take 100 mg by mouth daily. (Patient not taking: Reported on 05/03/2024), Disp: , Rfl:    aspirin 81 MG chewable tablet, Chew by mouth daily. (Patient not taking: Reported on 05/03/2024), Disp: , Rfl:    aspirin EC 325 MG tablet, Take 1 tablet by mouth daily. (Patient not taking: Reported on 05/03/2024), Disp: , Rfl:    cholecalciferol (  VITAMIN D3) 25 MCG (1000 UNIT) tablet, Take 1,000 Units by mouth daily. (Patient not taking: Reported on 05/03/2024), Disp: , Rfl:    ketoconazole  (NIZORAL ) 2 % shampoo, APPLY 1 APPLICATION TOPICALLY 2 (TWO) TIMES A WEEK. FOR 2-4 WEEKS, Disp: 120 mL, Rfl: 0   methocarbamol  (ROBAXIN ) 500 MG tablet, Take 1 tablet (500 mg total) by mouth 3 (three) times daily as needed for muscle spasms. (Patient not  taking: Reported on 05/03/2024), Disp: 30 tablet, Rfl: 1   Multiple Vitamins-Minerals (CENTRUM SILVER WOMEN 50+) TABS, Take 1 tablet by mouth daily., Disp: , Rfl:    nystatin  (MYCOSTATIN ) 100000 UNIT/ML suspension, Take 5 mLs (500,000 Units total) by mouth 4 (four) times daily., Disp: 473 mL, Rfl: 0   SUPER B COMPLEX/C PO, Take 1 tablet by mouth daily. (Patient not taking: Reported on 05/03/2024), Disp: , Rfl:    UNABLE TO FIND, ERT, Disp: , Rfl:  Medication Side Effects: none  Family Medical/ Social History: Changes? no  MENTAL HEALTH EXAM:  There were no vitals taken for this visit.There is no height or weight on file to calculate BMI.  General Appearance: Casual and Well Groomed  Eye Contact:  Good  Speech:  Clear and Coherent and Normal Rate  Volume:  Normal  Mood:  Euthymic  Affect:  Congruent  Thought Process:  Goal Directed and Descriptions of Associations: Circumstantial  Orientation:  Full (Time, Place, and Person)  Thought Content: Logical   Suicidal Thoughts:  No  Homicidal Thoughts:  No  Memory:  WNL  Judgement:  Good  Insight:  Good  Psychomotor Activity:  Normal  Concentration:  Concentration: Good and Attention Span: Good  Recall:  Good  Fund of Knowledge: Good  Language: Good  Assets:  Communication Skills Desire for Improvement Financial Resources/Insurance Housing Resilience Transportation Vocational/Educational  ADL's:  Intact  Cognition: WNL  Prognosis:  Good   ECT-MADRS    Flowsheet Row Video Visit from 08/09/2022 in [facility]  MADRS Total Score 45   GAD-7    Flowsheet Row Video Visit from 08/14/2023 in [facility] Office Visit from 05/12/2023 in [facility] Office Visit from 02/10/2023 in [facility] Office Visit from 01/01/2023 in [facility] Office Visit from 11/14/2022 in [facility]  Total GAD-7  Score 0 0 4 7 2    PHQ2-9    Flowsheet Row Video Visit from 08/14/2023 in [facility] Office Visit from 05/12/2023 in [facility] Office Visit from 02/10/2023 in [facility] Primary Care Office Visit from 01/01/2023 in [facility] Care Office Visit from 11/14/2022 in  [facility]  PHQ-2 Total Score 0 1 0 2 0  PHQ-9 Total Score 0 6 0 3 4   DIAGNOSES:    ICD-10-CM   1. Treatment-resistant depression  F32.9     2. Generalized anxiety disorder  F41.1     3. Social anxiety disorder  F40.10     4. Former smoker  Z87.891       Receiving Psychotherapy: No   RECOMMENDATIONS:  PDMP was reviewed. Ativan  filled 06/09/2024. I provided approximately 20 minutes of non-face-to-face time during this encounter, including time spent before and after the visit in records review, medical decision making, counseling pertinent to today's visit, and charting.   Recommend increasing the Zoloft .  She is on a pretty low dose and although she  is better, I think it will be more beneficial at a higher dose.  She would like to try it.  Continue Auvelity  45-105 mg, 1 p.o. twice daily. Continue Ativan  1 mg, 1 po qhs. Increase Zoloft  to 100 mg daily.   Return in 6 weeks.  Verneita Cooks, PA-C

## 2024-07-01 ENCOUNTER — Other Ambulatory Visit: Payer: Self-pay | Admitting: Family Medicine

## 2024-07-01 DIAGNOSIS — N6310 Unspecified lump in the right breast, unspecified quadrant: Secondary | ICD-10-CM

## 2024-07-21 ENCOUNTER — Ambulatory Visit
Admission: RE | Admit: 2024-07-21 | Discharge: 2024-07-21 | Disposition: A | Source: Ambulatory Visit | Attending: Family Medicine | Admitting: Family Medicine

## 2024-07-21 DIAGNOSIS — N6310 Unspecified lump in the right breast, unspecified quadrant: Secondary | ICD-10-CM

## 2024-09-01 ENCOUNTER — Other Ambulatory Visit: Payer: Self-pay | Admitting: Family Medicine

## 2024-09-01 DIAGNOSIS — R12 Heartburn: Secondary | ICD-10-CM
